# Patient Record
Sex: Female | Born: 1990 | Race: Black or African American | Hispanic: No | Marital: Single | Smoking: Never smoker
Health system: Southern US, Community
[De-identification: ages and names within clinical notes are randomized; demographics above are authoritative.]

## PROBLEM LIST (undated history)

## (undated) ENCOUNTER — Inpatient Hospital Stay (HOSPITAL_COMMUNITY): Payer: Self-pay

## (undated) DIAGNOSIS — IMO0002 Reserved for concepts with insufficient information to code with codable children: Secondary | ICD-10-CM

## (undated) DIAGNOSIS — D649 Anemia, unspecified: Secondary | ICD-10-CM

## (undated) DIAGNOSIS — F419 Anxiety disorder, unspecified: Secondary | ICD-10-CM

## (undated) DIAGNOSIS — R7303 Prediabetes: Secondary | ICD-10-CM

## (undated) HISTORY — DX: Reserved for concepts with insufficient information to code with codable children: IMO0002

---
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
## 2009-07-06 ENCOUNTER — Ambulatory Visit: Payer: Self-pay | Admitting: Obstetrics and Gynecology

## 2009-07-06 LAB — CONVERTED CEMR LAB
Antibody Screen: NEGATIVE
Basophils Absolute: 0 10*3/uL (ref 0.0–0.1)
Eosinophils Absolute: 0.1 10*3/uL (ref 0.0–0.7)
Eosinophils Relative: 1 % (ref 0–5)
Hepatitis B Surface Ag: NEGATIVE
Hgb F Quant: 0.5 % (ref 0.0–2.0)
Hgb S Quant: 0 % (ref 0.0–0.0)
Lymphocytes Relative: 18 % (ref 12–46)
Lymphs Abs: 1.7 10*3/uL (ref 0.7–4.0)
MCHC: 31.3 g/dL (ref 30.0–36.0)
MCV: 76.9 fL — ABNORMAL LOW (ref 78.0–100.0)
Neutro Abs: 7.1 10*3/uL (ref 1.7–7.7)
Neutrophils Relative %: 75 % (ref 43–77)
RBC: 3.9 M/uL (ref 3.87–5.11)
RDW: 16.8 % — ABNORMAL HIGH (ref 11.5–15.5)
WBC: 9.4 10*3/uL (ref 4.0–10.5)

## 2009-07-07 ENCOUNTER — Ambulatory Visit (HOSPITAL_COMMUNITY): Admission: RE | Admit: 2009-07-07 | Discharge: 2009-07-07 | Payer: Self-pay | Admitting: Obstetrics and Gynecology

## 2009-07-20 ENCOUNTER — Ambulatory Visit: Payer: Self-pay | Admitting: Obstetrics & Gynecology

## 2009-08-03 ENCOUNTER — Ambulatory Visit: Payer: Self-pay | Admitting: Obstetrics and Gynecology

## 2009-08-03 LAB — CONVERTED CEMR LAB: GC Probe Amp, Genital: NEGATIVE

## 2009-08-04 ENCOUNTER — Encounter: Payer: Self-pay | Admitting: Obstetrics and Gynecology

## 2009-08-11 ENCOUNTER — Inpatient Hospital Stay (HOSPITAL_COMMUNITY): Admission: AD | Admit: 2009-08-11 | Discharge: 2009-08-11 | Payer: Self-pay | Admitting: Family Medicine

## 2009-08-24 ENCOUNTER — Ambulatory Visit: Payer: Self-pay | Admitting: Obstetrics and Gynecology

## 2009-08-31 ENCOUNTER — Inpatient Hospital Stay (HOSPITAL_COMMUNITY): Admission: AD | Admit: 2009-08-31 | Discharge: 2009-09-02 | Payer: Self-pay | Admitting: Obstetrics & Gynecology

## 2009-08-31 ENCOUNTER — Ambulatory Visit: Payer: Self-pay | Admitting: Advanced Practice Midwife

## 2009-08-31 ENCOUNTER — Ambulatory Visit: Payer: Self-pay | Admitting: Obstetrics and Gynecology

## 2009-10-07 ENCOUNTER — Ambulatory Visit: Payer: Self-pay | Admitting: Obstetrics & Gynecology

## 2010-03-26 DIAGNOSIS — R87619 Unspecified abnormal cytological findings in specimens from cervix uteri: Secondary | ICD-10-CM

## 2010-03-26 DIAGNOSIS — IMO0002 Reserved for concepts with insufficient information to code with codable children: Secondary | ICD-10-CM

## 2010-03-26 HISTORY — DX: Unspecified abnormal cytological findings in specimens from cervix uteri: R87.619

## 2010-03-26 HISTORY — DX: Reserved for concepts with insufficient information to code with codable children: IMO0002

## 2010-06-09 ENCOUNTER — Ambulatory Visit: Payer: Self-pay | Admitting: Family Medicine

## 2010-06-12 LAB — RAPID URINE DRUG SCREEN, HOSP PERFORMED
Amphetamines: NOT DETECTED
Cocaine: NOT DETECTED
Tetrahydrocannabinol: NOT DETECTED

## 2010-06-12 LAB — POCT URINALYSIS DIP (DEVICE)
Bilirubin Urine: NEGATIVE
Glucose, UA: NEGATIVE mg/dL
Glucose, UA: NEGATIVE mg/dL
Ketones, ur: NEGATIVE mg/dL
Ketones, ur: NEGATIVE mg/dL
Nitrite: NEGATIVE
Nitrite: NEGATIVE
Protein, ur: NEGATIVE mg/dL
Specific Gravity, Urine: 1.01 (ref 1.005–1.030)
Urobilinogen, UA: 0.2 mg/dL (ref 0.0–1.0)
pH: 6.5 (ref 5.0–8.0)

## 2010-06-12 LAB — HIV ANTIBODY (ROUTINE TESTING W REFLEX): HIV: NONREACTIVE

## 2010-06-12 LAB — CBC
HCT: 36.9 % (ref 36.0–46.0)
Hemoglobin: 12 g/dL (ref 12.0–15.0)
MCHC: 32.5 g/dL (ref 30.0–36.0)
RBC: 3.57 MIL/uL — ABNORMAL LOW (ref 3.87–5.11)
WBC: 15.1 10*3/uL — ABNORMAL HIGH (ref 4.0–10.5)

## 2010-06-13 LAB — POCT URINALYSIS DIP (DEVICE)
Bilirubin Urine: NEGATIVE
Bilirubin Urine: NEGATIVE
Glucose, UA: NEGATIVE mg/dL
Hgb urine dipstick: NEGATIVE
Ketones, ur: NEGATIVE mg/dL
Ketones, ur: NEGATIVE mg/dL
Nitrite: NEGATIVE
Nitrite: NEGATIVE
Protein, ur: NEGATIVE mg/dL
Specific Gravity, Urine: 1.02 (ref 1.005–1.030)
Urobilinogen, UA: 0.2 mg/dL (ref 0.0–1.0)
pH: 6 (ref 5.0–8.0)

## 2010-06-14 LAB — POCT URINALYSIS DIP (DEVICE)
Hgb urine dipstick: NEGATIVE
Nitrite: NEGATIVE
Urobilinogen, UA: 0.2 mg/dL (ref 0.0–1.0)
pH: 7 (ref 5.0–8.0)

## 2010-07-03 ENCOUNTER — Ambulatory Visit: Payer: Self-pay | Admitting: Obstetrics & Gynecology

## 2010-07-12 ENCOUNTER — Ambulatory Visit (INDEPENDENT_AMBULATORY_CARE_PROVIDER_SITE_OTHER): Payer: Medicaid Other | Admitting: Obstetrics and Gynecology

## 2010-07-12 ENCOUNTER — Other Ambulatory Visit: Payer: Self-pay | Admitting: Obstetrics and Gynecology

## 2010-07-12 DIAGNOSIS — Z124 Encounter for screening for malignant neoplasm of cervix: Secondary | ICD-10-CM

## 2010-07-12 DIAGNOSIS — Z01419 Encounter for gynecological examination (general) (routine) without abnormal findings: Secondary | ICD-10-CM

## 2010-07-13 NOTE — Group Therapy Note (Signed)
NAME:  Leslie Simpson, Leslie Simpson               ACCOUNT NO.:  [id number]  MEDICAL RECORD NO.:  [id number]           PATIENT TYPE:  A  LOCATION:  [facility]                   FACILITY:  [facility]  PHYSICIAN:  Argentina Donovan, MD        DATE OF BIRTH:  11/16/1990  DATE OF SERVICE:  07/12/2010                                 CLINIC NOTE  The patient is a 20 year old African American female gravida 2, para 2-0- 0-2, in for her annual exam and first Pap smear, who delivered a baby successfully by VBAC in May 2011.  She nursed for 3 months and was given Depo-Provera.  She did not like it because of the weight gain, but she is afraid she is going to get pregnant.  She wanted to start on something __________ she has Medicaid.  We started her on Loestrin 11 Fe and I gave her prescription for that.  I told her that Medicaid may change which she is taking, but they should continue it.  She is going to start with the next period.  She had unprotected intercourse yesterday __________ she had a period on the 10th of the month.  I told her to wait until her next period before she started the pill.  The patient has no physical complaints.  PHYSICAL EXAMINATION:  ABDOMEN:  Soft, nontender.  No masses, no organomegaly. EXTERNAL GENITALIA:  Normal.  BUS within normal limits.  Vagina is clean.  There is a sort of a creamy discharge, non foul, no positive whiff.  Pap smear was taken.  Paracervix is parous.  Uterus anterior with normal size, shape, consistency.  Adnexa could not feel enlarged.  IMPRESSION:  Normal gynecological examination.  The patient during the Pap smear, was checked for wet prep as well as GC/Chlamydia and she want to be checked for all the other sexually transmitted diseases that we could do, so we are checking for hepatitis as well as HIV, syphilis and herpes simplex II.          ______________________________ Argentina Donovan, MD    PR/MEDQ  D:  07/12/2010  T:  07/13/2010  Job:  [id number]

## 2010-08-31 ENCOUNTER — Other Ambulatory Visit: Payer: Self-pay | Admitting: Obstetrics & Gynecology

## 2010-08-31 ENCOUNTER — Encounter (INDEPENDENT_AMBULATORY_CARE_PROVIDER_SITE_OTHER): Payer: Medicaid Other | Admitting: Obstetrics & Gynecology

## 2010-08-31 ENCOUNTER — Other Ambulatory Visit (HOSPITAL_COMMUNITY)
Admission: RE | Admit: 2010-08-31 | Discharge: 2010-08-31 | Disposition: A | Discharge status: Home / Self Care | Payer: Medicaid Other | Source: Ambulatory Visit | Attending: Obstetrics and Gynecology | Admitting: Obstetrics and Gynecology

## 2010-08-31 DIAGNOSIS — R87612 Low grade squamous intraepithelial lesion on cytologic smear of cervix (LGSIL): Secondary | ICD-10-CM

## 2010-08-31 DIAGNOSIS — N870 Mild cervical dysplasia: Secondary | ICD-10-CM | POA: Insufficient documentation

## 2010-08-31 LAB — POCT PREGNANCY, URINE: Preg Test, Ur: NEGATIVE

## 2010-09-23 DIAGNOSIS — IMO0002 Reserved for concepts with insufficient information to code with codable children: Secondary | ICD-10-CM

## 2010-10-04 ENCOUNTER — Ambulatory Visit: Payer: Medicaid Other | Admitting: Obstetrics & Gynecology

## 2010-11-11 ENCOUNTER — Emergency Department (HOSPITAL_COMMUNITY)
Admission: EM | Admit: 2010-11-11 | Discharge: 2010-11-11 | Discharge status: Against Medical Advice | Payer: Medicaid Other | Attending: Emergency Medicine | Admitting: Emergency Medicine

## 2010-11-11 DIAGNOSIS — R059 Cough, unspecified: Secondary | ICD-10-CM | POA: Insufficient documentation

## 2010-11-11 DIAGNOSIS — J029 Acute pharyngitis, unspecified: Secondary | ICD-10-CM | POA: Insufficient documentation

## 2010-11-11 DIAGNOSIS — R209 Unspecified disturbances of skin sensation: Secondary | ICD-10-CM | POA: Insufficient documentation

## 2010-11-11 DIAGNOSIS — R05 Cough: Secondary | ICD-10-CM | POA: Insufficient documentation

## 2011-01-17 ENCOUNTER — Ambulatory Visit: Payer: Medicaid Other | Admitting: Obstetrics and Gynecology

## 2011-07-06 ENCOUNTER — Emergency Department (HOSPITAL_COMMUNITY)
Admission: EM | Admit: 2011-07-06 | Discharge: 2011-07-06 | Disposition: A | Discharge status: Home / Self Care | Payer: Medicaid Other | Attending: Emergency Medicine | Admitting: Emergency Medicine

## 2011-07-06 ENCOUNTER — Encounter (HOSPITAL_COMMUNITY): Payer: Self-pay | Admitting: Emergency Medicine

## 2011-07-06 DIAGNOSIS — S90129A Contusion of unspecified lesser toe(s) without damage to nail, initial encounter: Secondary | ICD-10-CM | POA: Insufficient documentation

## 2011-07-06 DIAGNOSIS — W208XXA Other cause of strike by thrown, projected or falling object, initial encounter: Secondary | ICD-10-CM | POA: Insufficient documentation

## 2011-07-06 DIAGNOSIS — S90211A Contusion of right great toe with damage to nail, initial encounter: Secondary | ICD-10-CM

## 2011-07-06 MED ORDER — TRAMADOL HCL 50 MG PO TABS: 50.0000 mg | ORAL_TABLET | Freq: Four times a day (QID) | ORAL | Status: AC | PRN

## 2011-07-06 NOTE — Discharge Instructions (Signed)
Please read and follow all provided instructions.  Your diagnoses today include:  1. Subungual hematoma of great toe of right foot     Tests performed today include:  Vital signs. See below for your results today.   Medications prescribed:   Ultram - narcotic pain medication  You have been prescribed narcotic pain medication such as Vicodin or Percocet: DO NOT drive or perform any activities that require you to be awake and alert because this medicine can make you drowsy. BE VERY CAREFUL not to take multiple medicines containing Tylenol (also called acetaminophen). Doing so can lead to an overdose which can damage your liver and cause liver failure and possibly death.    Ibuprofen - anti-inflammatory pain medication  Do not exceed 800mg  ibuprofen every 8 hours  You have been prescribed an anti-inflammatory medication or NSAID. Take with food. Take smallest effective dose for the shortest duration needed for your pain. Stop taking if you experience stomach pain or vomiting.   Take any prescribed medications only as directed.  Home care instructions:  Follow any educational materials contained in this packet.  Follow-up instructions: Please follow-up with your primary care provider in the next 3 days for further evaluation of your symptoms. If you do not have a primary care doctor -- see below for referral information.   Return instructions:   Please return to the Emergency Department if you experience worsening symptoms.   Return with worsening pain, swelling, or redness of your toe.  Please return if you have any other emergent concerns.  Additional Information:  Your vital signs today were: BP 108/62  Pulse 65  Temp 98.4 F (36.9 C)  Resp 18  SpO2 100%  LMP 06/09/2011 If your blood pressure (BP) was elevated above 135/85 this visit, please have this repeated by your doctor within one month. -------------- No Primary Care Doctor Call [facility]  [phone] Other  agencies that provide inexpensive medical care    [facility]  [phone]    [facility]  [phone]    [facility]  [phone]    [facility]  [phone]    [facility]  [phone]    [facility]  [phone] -------------- RESOURCE GUIDE:  Dental Problems  Patients with Medicaid: [facility] [phone]                                            [phone] Phone:  [phone]                                                   Phone:  [phone]  If unable to pay or uninsured, contact:  [facility] or [facility]. to become qualified for the adult dental clinic.  Chronic Pain Problems Contact [facility]  [phone] Patients need to be referred by their primary care doctor.  Insufficient Money for Medicine Contact [employer]:  call "211" or [facility] [phone].  Psychological Services [facility]  [phone] [facility]  [phone] [facility]   800 [phone] (emergency services [phone])  Substance Abuse Resources [facility]  [phone] [facility] [phone] [facility]  [place] [phone] Floydene Flock [phone] Residential & Outpatient Substance Abuse Program  [phone]  Abuse/Neglect [facility] Child Abuse Hotline [phone])191-8261 [facility] Child Abuse Hotline 934-507-0080 (After Hours)  Emergency Shelter [facility] [phone]  Maternity Homes [facility] [place] [facility] [phone] [facility] [facility] [phone]  [facility] Resources  [facility]     [employer]                          [facility]. [address] [place]                       [address]      [address]  Blondell Reveal Phone:  [phone]                                   Phone:  [phone]                 Phone:  [phone]  [facility] Phone:  [phone]  [facility] Child Abuse Hotline 564-212-1195 [phone] (After Hours)

## 2011-07-06 NOTE — ED Notes (Signed)
Pt states glass from screen door fell onto her RT foot.  States skin is intact but there is bruising and she wants it checked out.

## 2011-07-06 NOTE — ED Notes (Signed)
Pt had glass from a door fall on her foot yesterday. Great toenail is black and bruised. States pain is not as bad today but wanted to get it checked out anyway. NAD.

## 2011-07-06 NOTE — ED Provider Notes (Signed)
History     CSN: [id number]  Arrival date & time 07/06/11  3244   First MD Initiated Contact with Patient 07/06/11 1720      Chief Complaint  Patient presents with  . Foot Pain    RT foot    (Consider location/radiation/quality/duration/timing/severity/associated sxs/prior treatment) HPI Comments: Patient presents after dropping a piece of glass from a door on her toes yesterday. Question of breakthrough. Patient complains of right great toe pain that is gradually improving. 800 mg ibuprofen tried without relief. Patient can walk however she states she limps.  Patient is a 21 y.o. female presenting with lower extremity pain. The history is provided by the patient.  Foot Pain This is a new problem. The current episode started yesterday. The problem has been gradually improving. Associated symptoms include arthralgias. Pertinent negatives include no joint swelling, neck pain, numbness or weakness. The symptoms are aggravated by walking and bending. She has tried NSAIDs for the symptoms. The treatment provided no relief.    History reviewed. No pertinent past medical history.  Past Surgical History  Procedure Date  . Cesarean section     No family history on file.  History  Substance Use Topics  . Smoking status: Not on file  . Smokeless tobacco: Not on file  . Alcohol Use:     OB History    Grav Para Term Preterm Abortions TAB SAB Ect Mult Living                  Review of Systems  Constitutional: Negative for activity change.  HENT: Negative for neck pain.   Musculoskeletal: Positive for arthralgias and gait problem. Negative for back pain and joint swelling.  Skin: Positive for color change. Negative for wound.  Neurological: Negative for weakness and numbness.    Allergies  Raspberry  Home Medications  No current outpatient prescriptions on file.  BP 108/62  Pulse 65  Temp 98.4 F (36.9 C)  Resp 18  SpO2 100%  LMP 06/09/2011  Physical Exam  Nursing  note and vitals reviewed. Constitutional: She is oriented to person, place, and time. She appears well-developed and well-nourished.  HENT:  Head: Normocephalic and atraumatic.  Eyes: Pupils are equal, round, and reactive to light.  Neck: Normal range of motion. Neck supple.  Cardiovascular: Exam reveals no decreased pulses.   Pulses:      Dorsalis pedis pulses are 2+ on the right side, and 2+ on the left side.       Posterior tibial pulses are 2+ on the right side, and 2+ on the left side.  Musculoskeletal: Normal range of motion. She exhibits edema and tenderness.       Right foot: She exhibits tenderness and swelling. She exhibits normal range of motion.       Feet:  Neurological: She is alert and oriented to person, place, and time. No sensory deficit.       Sensation normal.  Skin: Skin is warm and dry.  Psychiatric: She has a normal mood and affect.    ED Course  Procedures (including critical care time)  Labs Reviewed - No data to display No results found.   1. Subungual hematoma of great toe of right foot     5:59 PM Patient seen and examined.   Vital signs reviewed and are as follows: Filed Vitals:   07/06/11 1647  BP: 108/62  Pulse: 65  Temp: 98.4 F (36.9 C)  Resp: 18   Toe nail cleaned with alcohol  swab and trepanation performed using 18g needle. Significant amount of blood drained. Patient noted improvement. Will d/c to home on ultram.   Patient counseled on use of narcotic pain medications. Counseled not to combine these medications with others containing tylenol. Urged not to drink alcohol, drive, or perform any other activities that requires focus while taking these medications. The patient verbalizes understanding and agrees with the plan.  Patient urged to return with worsening pain, redness, swelling of toe. Urged to see her primary care physician if no improvement in one week. Patient verbalizes understanding and agrees with the plan.  MDM    Subungual hematoma, do not suspect fracture as patient has reasonable range of motion in toe.        Renne Crigler, [place] 07/07/11 0230

## 2011-07-13 NOTE — ED Provider Notes (Signed)
Medical screening examination/treatment/procedure(s) were performed by non-physician practitioner and as supervising physician I was immediately available for consultation/collaboration.  Raeford Razor, MD 07/13/11 [phone]

## 2011-07-19 ENCOUNTER — Ambulatory Visit: Payer: Medicaid Other | Admitting: Advanced Practice Midwife

## 2011-08-01 ENCOUNTER — Ambulatory Visit (INDEPENDENT_AMBULATORY_CARE_PROVIDER_SITE_OTHER): Payer: Medicaid Other | Admitting: Advanced Practice Midwife

## 2011-08-01 ENCOUNTER — Other Ambulatory Visit (HOSPITAL_COMMUNITY)
Admission: RE | Admit: 2011-08-01 | Discharge: 2011-08-01 | Disposition: A | Discharge status: Home / Self Care | Payer: Medicaid Other | Source: Ambulatory Visit | Attending: Obstetrics and Gynecology | Admitting: Obstetrics and Gynecology

## 2011-08-01 ENCOUNTER — Encounter: Payer: Self-pay | Admitting: Advanced Practice Midwife

## 2011-08-01 VITALS — BP 105/62 | HR 75 | Temp 98.4°F | Ht 62.0 in | Wt 153.8 lb

## 2011-08-01 DIAGNOSIS — N870 Mild cervical dysplasia: Secondary | ICD-10-CM

## 2011-08-01 DIAGNOSIS — Z113 Encounter for screening for infections with a predominantly sexual mode of transmission: Secondary | ICD-10-CM | POA: Insufficient documentation

## 2011-08-01 DIAGNOSIS — Z01419 Encounter for gynecological examination (general) (routine) without abnormal findings: Secondary | ICD-10-CM | POA: Insufficient documentation

## 2011-08-01 DIAGNOSIS — A64 Unspecified sexually transmitted disease: Secondary | ICD-10-CM

## 2011-08-01 LAB — RPR

## 2011-08-01 NOTE — Patient Instructions (Signed)
Pap Test A Pap test checks the cells in the cervix for any infections or changes that may turn into cancer. Have a Pap test every year if:  You are having sex (intercourse).   You are high risk for cervical cancer. You are high risk if:   You started having sex at a very young age.   You have sex with a lot of different partners.   You smoke.   You have human papillomavirus (HPV).   You have HIV.  BEFORE THE PROCEDURE  Schedule the Pap test before or after your period.   Do not have sex for 2 days before your Pap test.   For 3 days before your Pap test, do not douche or put any creams or medicines in your vagina.  PROCEDURE  Try to relax. You will be more comfortable. Try breathing slowly and deeply. Do not tighten up your muscles.   A tool (speculum) is put into your vagina and opens it up.   Your doctor will use a special swab or brush to take cells from your cervix.   You may feel pressure or pulling as the cells are taken off your cervix.   The cells will be checked under a microscope.   Safer Sex Your caregiver wants you to have this information about the infections that can be transmitted from sexual contact and how to prevent them. The idea behind safer sex is that you can be sexually active, and at the same time reduce the risk of giving or getting a sexually transmitted disease (STD). Every person should be aware of how to prevent him or herself and his or her sex partner from getting an STD. CAUSES OF STDS STDs are transmitted by sharing body fluids, which contain viruses and bacteria. The following fluids all transmit infections during sexual intercourse and sex acts:  Semen.   Saliva.   Urine.   Blood.   Vaginal mucus.  Examples of STDs include:  Chlamydia.   Gonorrhea.   Genital herpes.   Hepatitis B.   Human immunodeficiency virus or acquired immunodeficiency syndrome (HIV or AIDS).   Syphilis.   Trichomonas.   Pubic lice.   Human  papillomavirus (HPV), which may include:   Genital warts.   Cervical dysplasia.   Cervical cancer (can develop with certain types of HPV).  SYMPTOMS  Sexual diseases often cause few or no symptoms until they are advanced, so a person can be infected and spread the infection without knowing it. Some STDs respond to treatment very well. Others, like HIV and herpes, cannot be cured, but are treated to reduce their effects. Specific symptoms include:  Abnormal vaginal discharge.   Irritation or itching in and around the vagina, and in the pubic hair.   Pain during sexual intercourse.   Bleeding during sexual intercourse.   Pelvic or abdominal pain.   Fever.   Growths in and around the vagina.   An ulcer in or around the vagina.   Swollen glands in the groin area.  DIAGNOSIS   Blood tests.   Pap test.   Culture test of abnormal vaginal discharge.   A test that applies a solution and examines the cervix with a lighted magnifying scope (colposcopy).   A test that examines the pelvis with a lighted tube, through a small incision (laparoscopy).  TREATMENT  The treatment will depend on the cause of the STD.  Antibiotic treatment by injection, oral, creams, or suppositories in the vagina.   Over-the-counter  medicated shampoo, to get rid of pubic lice.   Removing or treating growths with medicine, freezing, burning (electrocautery), or surgery.   Surgery treatment for HPV of the cervix.   Supportive medicines for herpes, HIV, AIDS, and hepatitis.  Being careful cannot eliminate all risk of infection, but sex can be made much safer. Safe sexual practices include body massage and gentle touching. Masturbation is safe, as long as body fluids do not contact skin that has sores or cuts. Dry kissing and oral sex on a man wearing a latex condom or on a woman wearing a female condom is also safe. Slightly less safe is intercourse while the man wears a latex condom or wet kissing. It  is also safer to have one sex partner that you know is not having sex with anyone else. LENGTH OF ILLNESS An STD might be treated and cured in a week, sometimes a month, or more. And it can linger with symptoms for many years. STDs can also cause damage to the female organs. This can cause chronic pain, infertility, and recurrence of the STD, especially herpes, hepatitis, HIV, and HPV. HOME CARE INSTRUCTIONS AND PREVENTION  Alcohol and recreational drugs are often the reason given for not practicing safer sex. These substances affect your judgment. Alcohol and recreational drugs can also impair your immune system, making you more vulnerable to disease.   Do not engage in risky and dangerous sexual practices, including:   Vaginal or anal sex without a condom.   Oral sex on a man without a condom.   Oral sex on a woman without a female condom.   Using saliva to lubricate a condom.   Any other sexual contact in which body fluids or blood from one partner contact the other partner.   You should use only latex condoms for men and water soluble lubricants. Petroleum based lubricants or oils used to lubricate a condom will weaken the condom and increase the chance that it will break.   Think very carefully before having sex with anyone who is high risk for STDs and HIV. This includes IV drug users, people with multiple sexual partners, or people who have had an STD, or a positive hepatitis or HIV blood test.   Remember that even if your partner has had only one previous partner, their previous partner might have had multiple partners. If so, you are at high risk of being exposed to an STD. You and your sex partner should be the only sex partners with each other, with no one else involved.   A vaccine is available for hepatitis B and HPV through your caregiver or the Public Health Department. Everyone should be vaccinated with these vaccines.   Avoid risky sex practices. Sex acts that can break  the skin make you more likely to get an STD.  SEEK MEDICAL CARE IF:   If you think you have an STD, even if you do not have any symptoms. Contact your caregiver for evaluation and treatment, if needed.   You think or know your sex partner has acquired an STD.   You have any of the symptoms mentioned above.  Document Released: 04/19/2004 Document Revised: 03/01/2011 Document Reviewed: 02/09/2009 [facility] Patient Information 2012 [place], [place]. AFTER THE PROCEDURE You may have a few spots of blood from your vagina for 2 to 3 days. If you have a lot of bleeding, or it does not stop in a few days, tell your doctor. Finding out the results of your test Ask  when your test results will be ready. Make sure you get your test results. PAP TEST RECOMMENDATIONS  Your first Pap test should be done at age 59.   Pap tests are repeated every 2 years between ages 50 and 26.   Beginning at age 29, you should have a Pap test every 3 years as long as your past 3 PAP tests have been normal.   If you are at risk of getting cervical cancer. Talk to your caregiver if a new problem shows up soon after your last Pap test.   If you have had past treatment for cervical cancer or a condition that could lead to cancer, you need Pap tests and screening for cancer for at least 20 years after your treatment.  WHO DOES NOT NEED TO HAVE A PAP TEST? You do not need a Pap test if:  If you had a hysterectomy for a problem that was not cancer or a condition that could lead to cancer. A regular exam is still recommended.    If you are between ages 72 and 73, and you have had normal Pap tests for 10 years. A regular exam is still recommended.    If Pap tests have been discontinued, risk factors (such as a new sexual partner)need to be looked at again to see if screening should be started again.  Document Released: 04/14/2010 Document Revised: 03/01/2011 Document Reviewed: 04/14/2010 [facility] Patient Information  2012 [place], [place].

## 2011-08-01 NOTE — Progress Notes (Signed)
  Subjective:     Leslie Simpson is a 21 y.o. woman who comes in today for a  pap smear and STD screening. Her most recent annual exam was on 06/2010. 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 Follow up was recommended for December, but patient did not keep that appointment. Requests STD testing today. Reports discharge x 2 weeks, also states she thinks she noticed a bump on her right labia, not painful. Contraception: condoms  The following portions of the patient's history were reviewed and updated as appropriate: allergies, current medications, past family history, past medical history, past social history, past surgical history and problem list.  Review of Systems Pertinent items are noted in HPI.   Objective:    LMP 06/09/2011 Physical Exam  Nursing note and vitals reviewed. Constitutional: She is oriented to person, place, and time. She appears well-developed and well-nourished. No distress.  Musculoskeletal: Normal range of motion.  Neurological: She is alert and oriented to person, place, and time.  Skin: Skin is warm and dry.  Psychiatric: She has a normal mood and affect.    Pelvic Exam: cervix normal in appearance, external genitalia normal, positive findings: vaginal discharge:  white and uterus normal size, shape, and consistency. Pap smear obtained.   Assessment:    Follow up pap smear.  History of abnormal pap.  STD screening  Plan:  Pap, GC, wet prep, HIV, RPR and Hep B today as requested per patient  Follow up as indicated by Pap results.

## 2011-08-02 LAB — WET PREP, GENITAL: Trich, Wet Prep: NONE SEEN

## 2011-08-07 ENCOUNTER — Telehealth: Payer: Self-pay | Admitting: *Deleted

## 2011-08-07 MED ORDER — FLUCONAZOLE 150 MG PO TABS: 150.0000 mg | ORAL_TABLET | Freq: Once | ORAL | Status: AC

## 2011-08-07 MED ORDER — METRONIDAZOLE 500 MG PO TABS: 500.0000 mg | ORAL_TABLET | Freq: Two times a day (BID) | ORAL | Status: AC

## 2011-08-07 NOTE — Telephone Encounter (Signed)
Pt left message requesting test results.  I reviewed results and consulted with Dr. Macon Large.  I called pt and informed her of results of all tests performed and treatment ordered. Pt voiced understanding.

## 2011-11-02 IMAGING — US US OB DETAIL+14 WK
2 series · 14 of 28 positions shown · non-contrast
Comparison: none

OBSTETRICAL ULTRASOUND:
 This ultrasound exam was performed in the [HOSPITAL] Ultrasound Department.  The OB US report was generated in the AS system, and faxed to the ordering physician.  This report is also available in [HOSPITAL]?s AccessANYware and in [REDACTED] PACS.

[Series 1: us ob detail +14 wk · 82 acquisitions, 13 frames shown (1 of 2)]
[im 4/82]
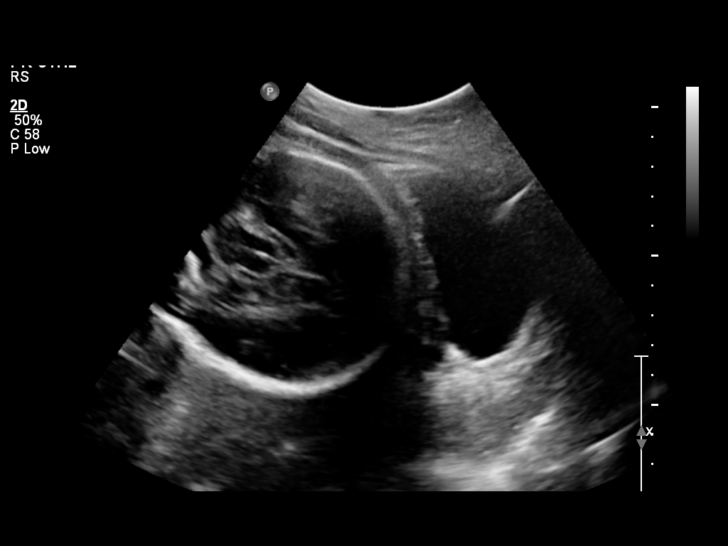
[im 10/82]
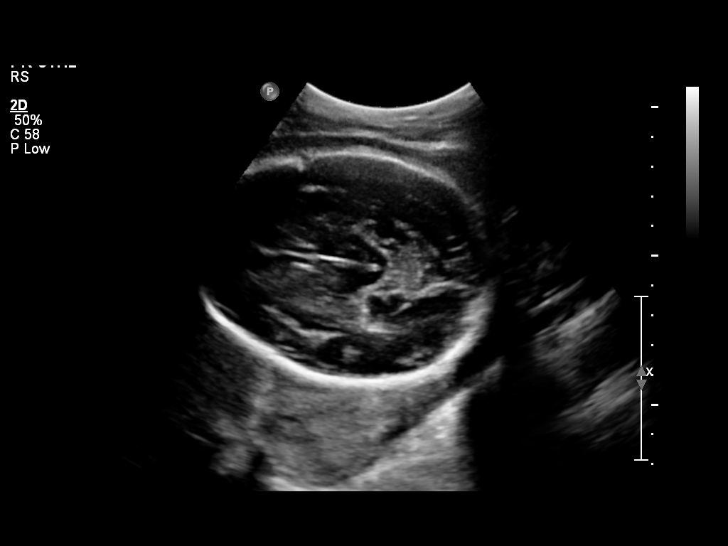
[im 17/82]
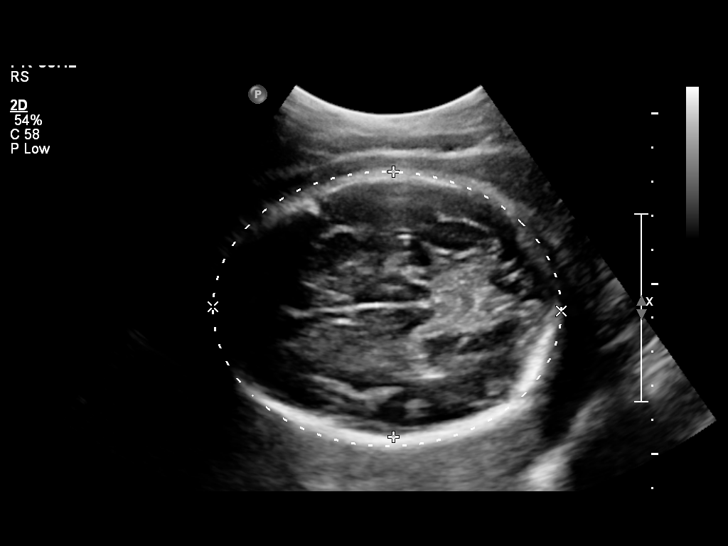
[im 23/82]
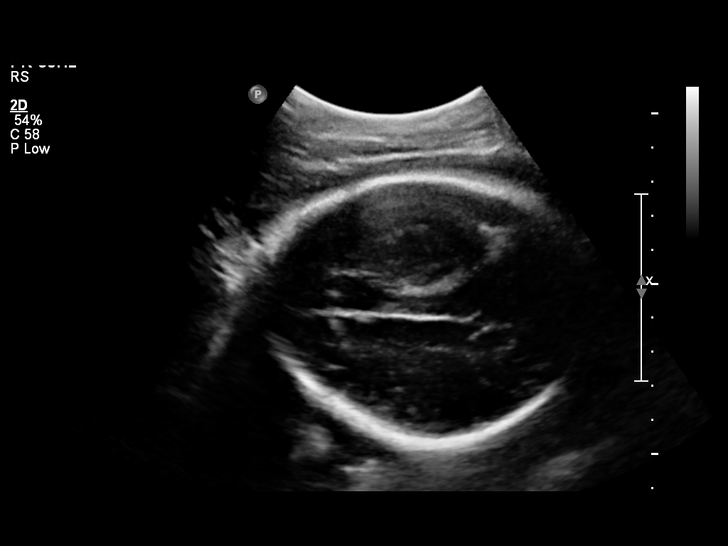
[im 30/82]
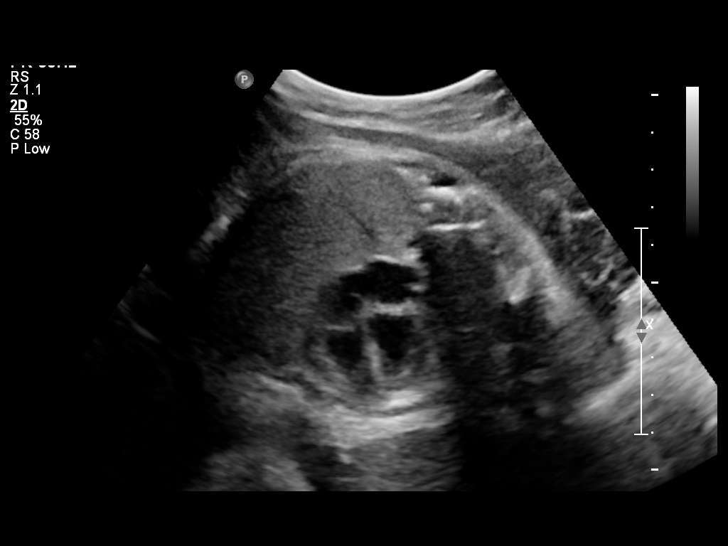
[im 36/82]
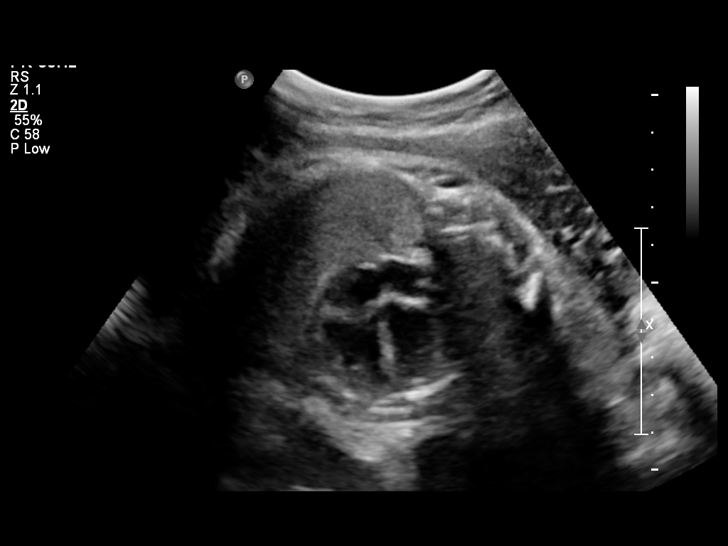
[im 43/82]
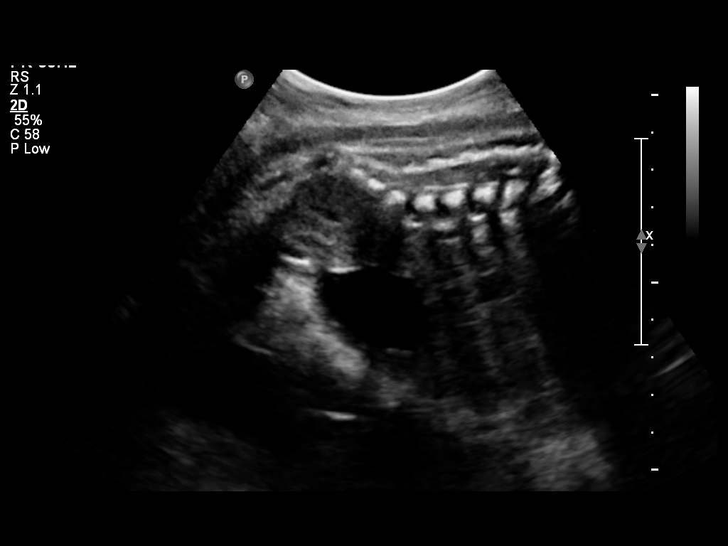
[im 49/82]
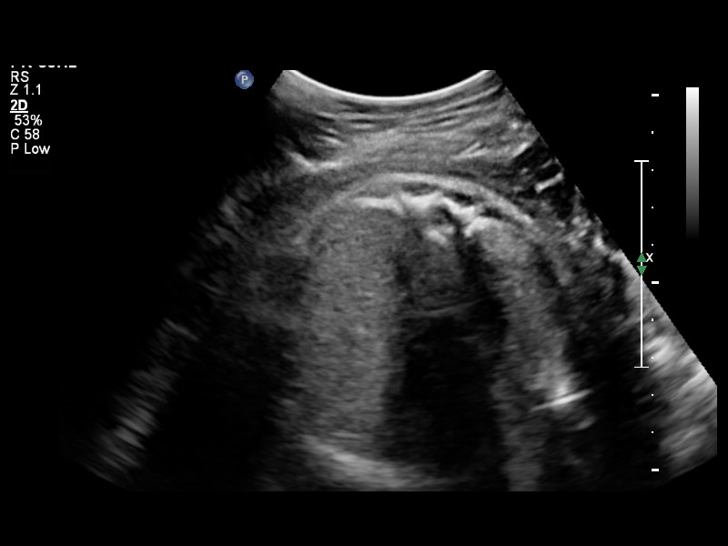
[im 56/82]
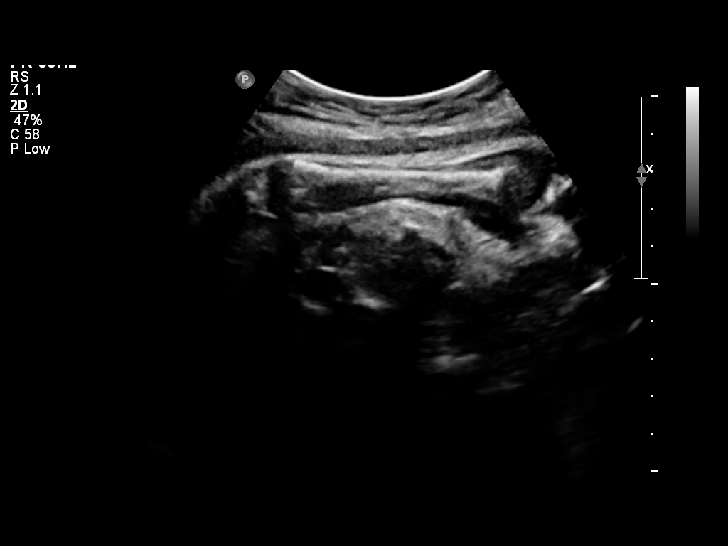
[im 62/82]
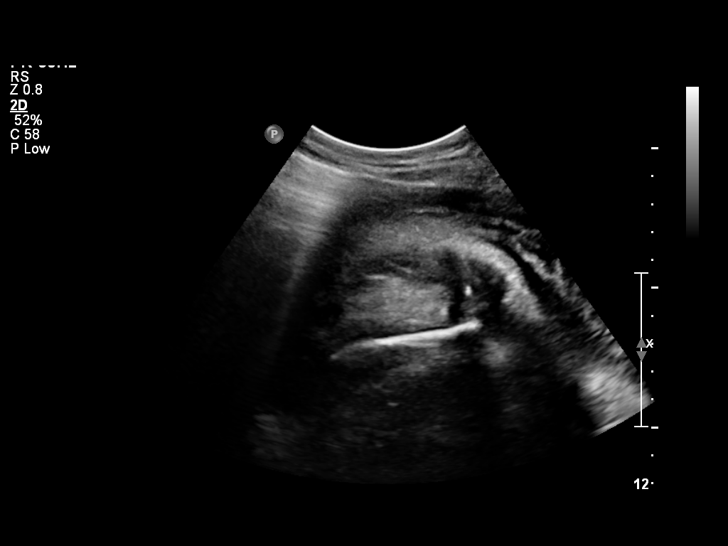
[im 69/82]
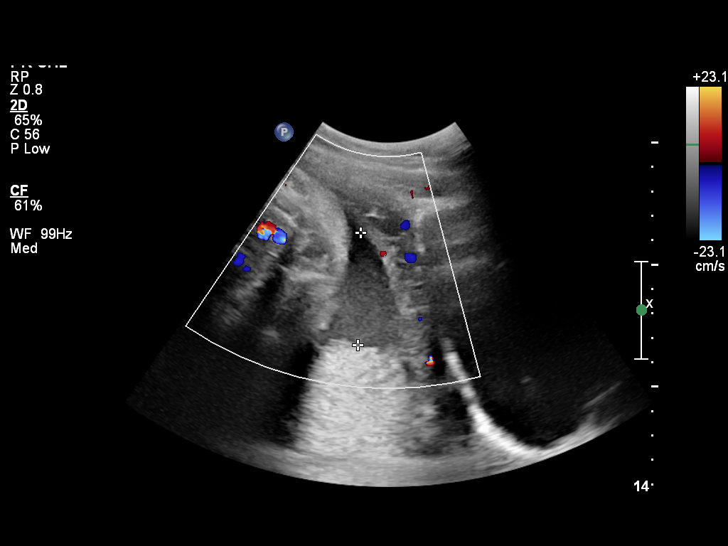
[im 75/82]
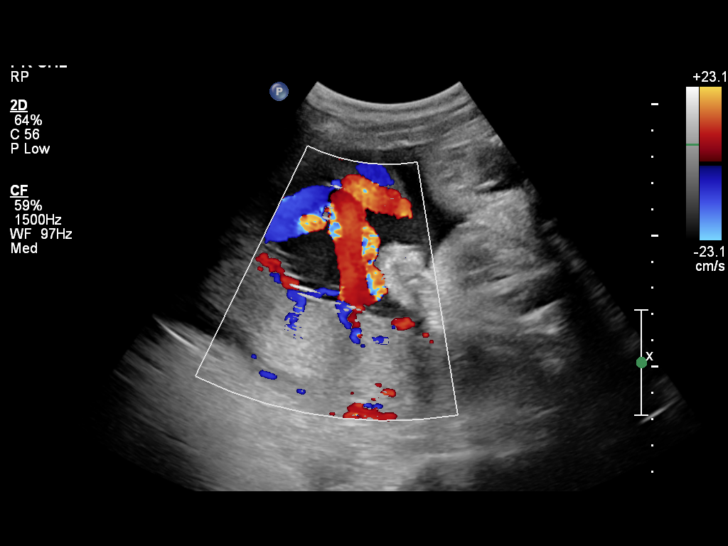
[im 82/82]
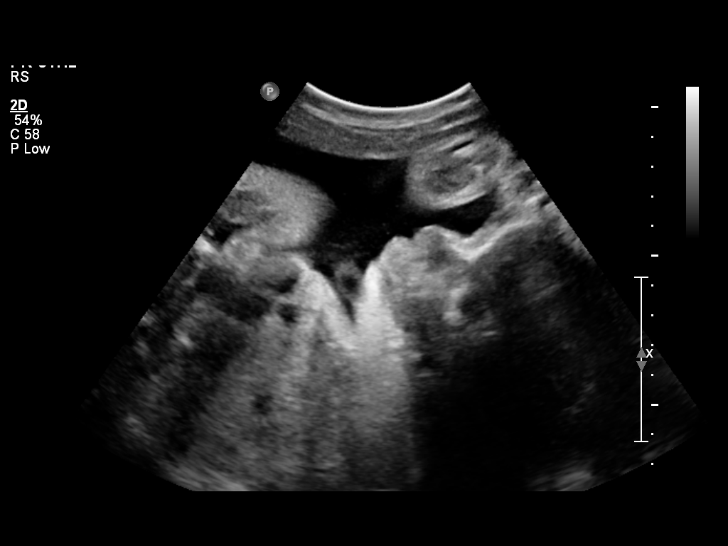

[Series 1: us ob detail +14 wk · 1 of 6 slices shown (2 of 2)]
[im 6/6]
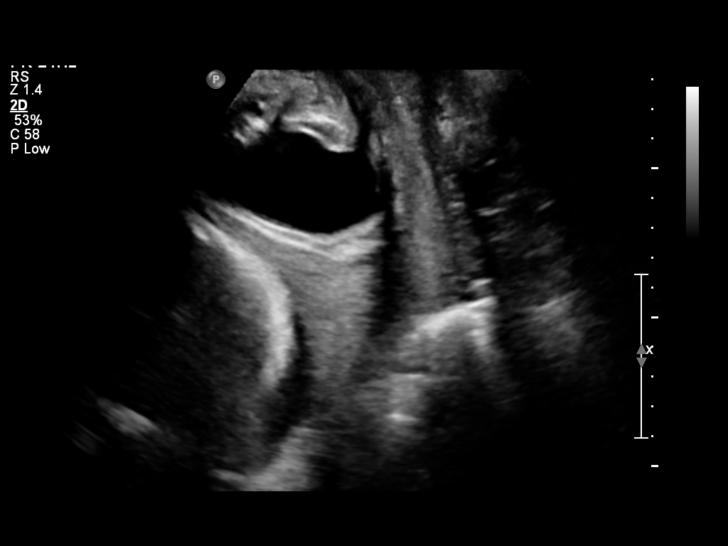

[14 of 28 positions shown; findings below may reference images not displayed]

IMPRESSION: See AS Obstetric US report.

## 2013-08-20 ENCOUNTER — Ambulatory Visit: Payer: Medicaid Other | Admitting: Family Medicine

## 2013-12-06 ENCOUNTER — Encounter (HOSPITAL_COMMUNITY): Payer: Self-pay | Admitting: Emergency Medicine

## 2013-12-06 ENCOUNTER — Emergency Department (HOSPITAL_COMMUNITY)
Admission: EM | Admit: 2013-12-06 | Discharge: 2013-12-06 | Disposition: A | Discharge status: Home / Self Care | Payer: Medicaid Other | Attending: Emergency Medicine | Admitting: Emergency Medicine

## 2013-12-06 DIAGNOSIS — R0782 Intercostal pain: Secondary | ICD-10-CM

## 2013-12-06 DIAGNOSIS — A599 Trichomoniasis, unspecified: Secondary | ICD-10-CM

## 2013-12-06 DIAGNOSIS — Z3202 Encounter for pregnancy test, result negative: Secondary | ICD-10-CM | POA: Insufficient documentation

## 2013-12-06 DIAGNOSIS — A499 Bacterial infection, unspecified: Secondary | ICD-10-CM | POA: Insufficient documentation

## 2013-12-06 DIAGNOSIS — R0789 Other chest pain: Secondary | ICD-10-CM | POA: Insufficient documentation

## 2013-12-06 DIAGNOSIS — R109 Unspecified abdominal pain: Secondary | ICD-10-CM | POA: Insufficient documentation

## 2013-12-06 DIAGNOSIS — B9689 Other specified bacterial agents as the cause of diseases classified elsewhere: Secondary | ICD-10-CM

## 2013-12-06 DIAGNOSIS — N390 Urinary tract infection, site not specified: Secondary | ICD-10-CM

## 2013-12-06 DIAGNOSIS — N760 Acute vaginitis: Secondary | ICD-10-CM | POA: Insufficient documentation

## 2013-12-06 DIAGNOSIS — A5901 Trichomonal vulvovaginitis: Secondary | ICD-10-CM | POA: Insufficient documentation

## 2013-12-06 LAB — WET PREP, GENITAL: Yeast Wet Prep HPF POC: NONE SEEN

## 2013-12-06 LAB — URINALYSIS, ROUTINE W REFLEX MICROSCOPIC
Bilirubin Urine: NEGATIVE
GLUCOSE, UA: NEGATIVE mg/dL
KETONES UR: NEGATIVE mg/dL
Nitrite: NEGATIVE
Protein, ur: NEGATIVE mg/dL
Specific Gravity, Urine: 1.02 (ref 1.005–1.030)
Urobilinogen, UA: 0.2 mg/dL (ref 0.0–1.0)
pH: 6 (ref 5.0–8.0)

## 2013-12-06 LAB — URINE MICROSCOPIC-ADD ON

## 2013-12-06 LAB — HIV ANTIBODY (ROUTINE TESTING W REFLEX): HIV 1&2 Ab, 4th Generation: NONREACTIVE

## 2013-12-06 LAB — RPR

## 2013-12-06 MED ORDER — METRONIDAZOLE 500 MG PO TABS: 500.0000 mg | ORAL_TABLET | Freq: Two times a day (BID) | ORAL | Status: DC

## 2013-12-06 MED ORDER — LIDOCAINE HCL 1 % IJ SOLN
INTRAMUSCULAR | Status: AC
  Administered 2013-12-06: 20 mL
  Filled 2013-12-06: qty 20

## 2013-12-06 MED ORDER — AZITHROMYCIN 1 G PO PACK
1.0000 g | PACK | Freq: Once | ORAL | Status: AC
  Administered 2013-12-06: 1 g via ORAL
  Filled 2013-12-06: qty 1

## 2013-12-06 MED ORDER — CEFTRIAXONE SODIUM 250 MG IJ SOLR
250.0000 mg | Freq: Once | INTRAMUSCULAR | Status: AC
  Administered 2013-12-06: 250 mg via INTRAMUSCULAR
  Filled 2013-12-06: qty 250

## 2013-12-06 MED ORDER — CEPHALEXIN 500 MG PO CAPS: 500.0000 mg | ORAL_CAPSULE | Freq: Four times a day (QID) | ORAL | Status: DC

## 2013-12-06 NOTE — ED Provider Notes (Addendum)
CSN: [id number]     Arrival date & time 12/06/13  1016 History   First MD Initiated Contact with Patient 12/06/13 1040     Chief Complaint  Patient presents with  . Abdominal Pain  . Chest Pain     (Consider location/radiation/quality/duration/timing/severity/associated sxs/prior Treatment) HPI Comments: Patient here complaining of worsening sharp chest pain x1 year which is been intermittent. Symptoms worse with certain movements and denies any anginal components. No cough or congestion. No rashes to her chest noted. Symptoms last for seconds and resolve with remaining still. No treatment ever used for this. Denies any syncope or near-syncope.  Patient also complains of 2 month history of intermittent possible vaginal bleeding. Notes pink tinged staining on the toliet paper after she wipes. Patient is unsure if the blood is from her vaginal or her urethra. Denies any associated abdominal cramping. Denies any dysuria. Symptoms persisted nothing makes it better. No treatment used prior to arrival.  Patient is a 23 y.o. female presenting with abdominal pain and chest pain. The history is provided by the patient.  Abdominal Pain Associated symptoms: chest pain   Chest Pain Associated symptoms: abdominal pain     Past Medical History  Diagnosis Date  . Abnormal pap 2012   Past Surgical History  Procedure Laterality Date  . Cesarean section     No family history on file. History  Substance Use Topics  . Smoking status: Never Smoker   . Smokeless tobacco: Never Used  . Alcohol Use: Yes     Comment: social drinking   OB History   Grav Para Term Preterm Abortions TAB SAB Ect Mult Living   Review of Systems  Cardiovascular: Positive for chest pain.  Gastrointestinal: Positive for abdominal pain.  All other systems reviewed and are negative.     Allergies  Oysters and Raspberry  Home Medications   Prior to Admission medications   Medication Sig Start  Date End Date Taking? Authorizing Provider  ibuprofen (ADVIL,MOTRIN) 200 MG tablet Take 400 mg by mouth every 6 (six) hours as needed for moderate pain.   Yes Historical Provider, MD   BP 118/57  Pulse 62  Temp(Src) 98 F (36.7 C) (Oral)  Resp 20  SpO2 100%  LMP 11/21/2013 Physical Exam  Nursing note and vitals reviewed. Constitutional: She is oriented to person, place, and time. She appears well-developed and well-nourished.  Non-toxic appearance. No distress.  HENT:  Head: Normocephalic and atraumatic.  Eyes: Conjunctivae, EOM and lids are normal. Pupils are equal, round, and reactive to light.  Neck: Normal range of motion. Neck supple. No tracheal deviation present. No mass present.  Cardiovascular: Normal rate, regular rhythm and normal heart sounds.  Exam reveals no gallop.   No murmur heard. Pulmonary/Chest: Effort normal and breath sounds normal. No stridor. No respiratory distress. She has no decreased breath sounds. She has no wheezes. She has no rhonchi. She has no rales.    Abdominal: Soft. Normal appearance and bowel sounds are normal. She exhibits no distension. There is no tenderness. There is no rebound and no CVA tenderness.  Genitourinary: No bleeding around the vagina. Vaginal discharge found.  Musculoskeletal: Normal range of motion. She exhibits no edema and no tenderness.  Neurological: She is alert and oriented to person, place, and time. She has normal strength. No cranial nerve deficit or sensory deficit. GCS eye subscore is 4. GCS verbal subscore is 5. GCS  motor subscore is 6.  Skin: Skin is warm and dry. No abrasion and no rash noted.  Psychiatric: She has a normal mood and affect. Her speech is normal and behavior is normal.    ED Course  Procedures (including critical care time) Labs Review Labs Reviewed  URINE CULTURE  GC/CHLAMYDIA PROBE AMP  WET PREP, GENITAL  URINALYSIS, ROUTINE W REFLEX MICROSCOPIC  POC URINE PREG, ED    Imaging Review No  results found.   EKG Interpretation   Date/Time:  Sunday December 06 2013 11:23:56 EDT Ventricular Rate:  63 PR Interval:  210 QRS Duration: 84 QT Interval:  390 QTC Calculation: 399 R Axis:   82 Text Interpretation:  Sinus arrhythmia Prolonged PR interval Baseline  wander in lead(s) II III aVF V2 Confirmed by Evey Mcmahan  MD, Corinthian Kemler ([id number]) on  12/06/2013 1:24:19 PM      MDM   Final diagnoses:  None   Chest wall pain appears to be MSK Pt to be treated for STI and uti with multiple meds    Toy Baker, MD 12/06/13 1328  Toy Baker, MD 12/06/13 1328

## 2013-12-06 NOTE — ED Notes (Addendum)
Pt c/o cramping chest pain for over a year, but more sharp today and moving into back. Pain without activity. Denies diaphoresis. Also c/o intermittent upper L abd pain, cramping worse when urinating. Reports what she believes is blood in urine x2. Also reports vaginal bleeding, no clots or even period like, only sts a little blood on toilet paper after wiping. Denies n/v/d. Denies any urinary symptoms or fever.

## 2013-12-06 NOTE — Discharge Instructions (Signed)
Sexually Transmitted Disease °A sexually transmitted disease (STD) is a disease or infection that may be passed (transmitted) from person to person, usually during sexual activity. This may happen by way of saliva, semen, blood, vaginal mucus, or urine. Common STDs include:  °· Gonorrhea.   °· Chlamydia.   °· Syphilis.   °· HIV and AIDS.   °· Genital herpes.   °· Hepatitis B and C.   °· Trichomonas.   °· Human papillomavirus (HPV).   °· Pubic lice.   °· Scabies. °· Mites. °· Bacterial vaginosis. °WHAT ARE CAUSES OF STDs? °An STD may be caused by bacteria, a virus, or parasites. STDs are often transmitted during sexual activity if one person is infected. However, they may also be transmitted through nonsexual means. STDs may be transmitted after:  °· Sexual intercourse with an infected person.   °· Sharing sex toys with an infected person.   °· Sharing needles with an infected person or using unclean piercing or tattoo needles. °· Having intimate contact with the genitals, mouth, or rectal areas of an infected person.   °· Exposure to infected fluids during birth. °WHAT ARE THE SIGNS AND SYMPTOMS OF STDs? °Different STDs have different symptoms. Some people may not have any symptoms. If symptoms are present, they may include:  °· Painful or bloody urination.   °· Pain in the pelvis, abdomen, vagina, anus, throat, or eyes.   °· A skin rash, itching, or irritation. °· Growths, ulcerations, blisters, or sores in the genital and anal areas. °· Abnormal vaginal discharge with or without bad odor.   °· Penile discharge in men.   °· Fever.   °· Pain or bleeding during sexual intercourse.   °· Swollen glands in the groin area.   °· Yellow skin and eyes (jaundice). This is seen with hepatitis.   °· Swollen testicles. °· Infertility. °· Sores and blisters in the mouth. °HOW ARE STDs DIAGNOSED? °To make a diagnosis, your health care provider may:  °· Take a medical history.   °· Perform a physical exam.   °· Take a sample of  any discharge to examine. °· Swab the throat, cervix, opening to the penis, rectum, or vagina for testing. °· Test a sample of your first morning urine.   °· Perform blood tests.   °· Perform a Pap test, if this applies.   °· Perform a colposcopy.   °· Perform a laparoscopy.   °HOW ARE STDs TREATED? ° Treatment depends on the STD. Some STDs may be treated but not cured.  °· Chlamydia, gonorrhea, trichomonas, and syphilis can be cured with antibiotic medicine.   °· Genital herpes, hepatitis, and HIV can be treated, but not cured, with prescribed medicines. The medicines lessen symptoms.   °· Genital warts from HPV can be treated with medicine or by freezing, burning (electrocautery), or surgery. Warts may come back.   °· HPV cannot be cured with medicine or surgery. However, abnormal areas may be removed from the cervix, vagina, or vulva.   °· If your diagnosis is confirmed, your recent sexual partners need treatment. This is true even if they are symptom-free or have a negative culture or evaluation. They should not have sex until their health care providers say it is okay. °HOW CAN I REDUCE MY RISK OF GETTING AN STD? °Take these steps to reduce your risk of getting an STD: °· Use latex condoms, dental dams, and water-soluble lubricants during sexual activity. Do not use petroleum jelly or oils. °· Avoid having multiple sex partners. °· Do not have sex with someone who has other sex partners. °· Do not have sex with anyone you do not know or who is at   high risk for an STD.  Avoid risky sex practices that can break your skin.  Do not have sex if you have open sores on your mouth or skin.  Avoid drinking too much alcohol or taking illegal drugs. Alcohol and drugs can affect your judgment and put you in a vulnerable position.  Avoid engaging in oral and anal sex acts.  Get vaccinated for HPV and hepatitis. If you have not received these vaccines in the past, talk to your health care provider about whether one  or both might be right for you.   If you are at risk of being infected with HIV, it is recommended that you take a prescription medicine daily to prevent HIV infection. This is called pre-exposure prophylaxis (PrEP). You are considered at risk if:  You are a man who has sex with other men (MSM).  You are a heterosexual man or woman and are sexually active with more than one partner.  You take drugs by injection.  You are sexually active with a partner who has HIV.  Talk with your health care provider about whether you are at high risk of being infected with HIV. If you choose to begin PrEP, you should first be tested for HIV. You should then be tested every 3 months for as long as you are taking PrEP.  WHAT SHOULD I DO IF I THINK I HAVE AN STD?  See your health care provider.   Tell your sexual partner(s). They should be tested and treated for any STDs.  Do not have sex until your health care provider says it is okay. WHEN SHOULD I GET IMMEDIATE MEDICAL CARE? Contact your health care provider right away if:   You have severe abdominal pain.  You are a man and notice swelling or pain in your testicles.  You are a woman and notice swelling or pain in your vagina. Document Released: 06/02/2002 Document Revised: 03/17/2013 Document Reviewed: 09/30/2012 [facility] Patient Information 2015 [place], [place]. This information is not intended to replace advice given to you by your health care provider. Make sure you discuss any questions you have with your health care provider. Bacterial Vaginosis Bacterial vaginosis is a vaginal infection that occurs when the normal balance of bacteria in the vagina is disrupted. It results from an overgrowth of certain bacteria. This is the most common vaginal infection in women of childbearing age. Treatment is important to prevent complications, especially in pregnant women, as it can cause a premature delivery. CAUSES  Bacterial vaginosis is caused by  an increase in harmful bacteria that are normally present in smaller amounts in the vagina. Several different kinds of bacteria can cause bacterial vaginosis. However, the reason that the condition develops is not fully understood. RISK FACTORS Certain activities or behaviors can put you at an increased risk of developing bacterial vaginosis, including:  Having a new sex partner or multiple sex partners.  Douching.  Using an intrauterine device (IUD) for contraception. Women do not get bacterial vaginosis from toilet seats, bedding, swimming pools, or contact with objects around them. SIGNS AND SYMPTOMS  Some women with bacterial vaginosis have no signs or symptoms. Common symptoms include:  Grey vaginal discharge.  A fishlike odor with discharge, especially after sexual intercourse.  Itching or burning of the vagina and vulva.  Burning or pain with urination. DIAGNOSIS  Your health care provider will take a medical history and examine the vagina for signs of bacterial vaginosis. A sample of vaginal fluid may be taken. Your  health care provider will look at this sample under a microscope to check for bacteria and abnormal cells. A vaginal pH test may also be done.  TREATMENT  Bacterial vaginosis may be treated with antibiotic medicines. These may be given in the form of a pill or a vaginal cream. A second round of antibiotics may be prescribed if the condition comes back after treatment.  HOME CARE INSTRUCTIONS   Only take over-the-counter or prescription medicines as directed by your health care provider.  If antibiotic medicine was prescribed, take it as directed. Make sure you finish it even if you start to feel better.  Do not have sex until treatment is completed.  Tell all sexual partners that you have a vaginal infection. They should see their health care provider and be treated if they have problems, such as a mild rash or itching.  Practice safe sex by using condoms and  only having one sex partner. SEEK MEDICAL CARE IF:   Your symptoms are not improving after 3 days of treatment.  You have increased discharge or pain.  You have a fever. MAKE SURE YOU:   Understand these instructions.  Will watch your condition.  Will get help right away if you are not doing well or get worse. FOR MORE INFORMATION  Centers for Disease Control and Prevention, Division of STD Prevention: [employer]za American Sexual Health Association (ASHA): www.ashastd.org  Document Released: 03/12/2005 Document Revised: 12/31/2012 Document Reviewed: 10/22/2012 [facility] Patient Information 2015 [place], [place]. This information is not intended to replace advice given to you by your health care provider. Make sure you discuss any questions you have with your health care provider. Trichomoniasis Trichomoniasis is an infection caused by an organism called Trichomonas. The infection can affect both women and men. In women, the outer female genitalia and the vagina are affected. In men, the penis is mainly affected, but the prostate and other reproductive organs can also be involved. Trichomoniasis is a sexually transmitted infection (STI) and is most often passed to another person through sexual contact.  RISK FACTORS  Having unprotected sexual intercourse.  Having sexual intercourse with an infected partner. SIGNS AND SYMPTOMS  Symptoms of trichomoniasis in women include:  Abnormal gray-green frothy vaginal discharge.  Itching and irritation of the vagina.  Itching and irritation of the area outside the vagina. Symptoms of trichomoniasis in men include:   Penile discharge with or without pain.  Pain during urination. This results from inflammation of the urethra. DIAGNOSIS  Trichomoniasis may be found during a Pap test or physical exam. Your health care provider may use one of the following methods to help diagnose this infection:  Examining vaginal discharge under a  microscope. For men, urethral discharge would be examined.  Testing the pH of the vagina with a test tape.  Using a vaginal swab test that checks for the Trichomonas organism. A test is available that provides results within a few minutes.  Doing a culture test for the organism. This is not usually needed. TREATMENT   You may be given medicine to fight the infection. Women should inform their health care provider if they could be or are pregnant. Some medicines used to treat the infection should not be taken during pregnancy.  Your health care provider may recommend over-the-counter medicines or creams to decrease itching or irritation.  Your sexual partner will need to be treated if infected. HOME CARE INSTRUCTIONS   Take medicines only as directed by your health care provider.  Take over-the-counter medicine  for itching or irritation as directed by your health care provider.  Do not have sexual intercourse while you have the infection.  Women should not douche or wear tampons while they have the infection.  Discuss your infection with your partner. Your partner may have gotten the infection from you, or you may have gotten it from your partner.  Have your sex partner get examined and treated if necessary.  Practice safe, informed, and protected sex.  See your health care provider for other STI testing. SEEK MEDICAL CARE IF:   You still have symptoms after you finish your medicine.  You develop abdominal pain.  You have pain when you urinate.  You have bleeding after sexual intercourse.  You develop a rash.  Your medicine makes you sick or makes you throw up (vomit). MAKE SURE YOU:  Understand these instructions.  Will watch your condition.  Will get help right away if you are not doing well or get worse. Document Released: 09/05/2000 Document Revised: 07/27/2013 Document Reviewed: 12/22/2012 [facility] Patient Information 2015 [place], [place]. This information is  not intended to replace advice given to you by your health care provider. Make sure you discuss any questions you have with your health care provider. Urinary Tract Infection Urinary tract infections (UTIs) can develop anywhere along your urinary tract. Your urinary tract is your body's drainage system for removing wastes and extra water. Your urinary tract includes two kidneys, two ureters, a bladder, and a urethra. Your kidneys are a pair of bean-shaped organs. Each kidney is about the size of your fist. They are located below your ribs, one on each side of your spine. CAUSES Infections are caused by microbes, which are microscopic organisms, including fungi, viruses, and bacteria. These organisms are so small that they can only be seen through a microscope. Bacteria are the microbes that most commonly cause UTIs. SYMPTOMS  Symptoms of UTIs may vary by age and gender of the patient and by the location of the infection. Symptoms in young women typically include a frequent and intense urge to urinate and a painful, burning feeling in the bladder or urethra during urination. Older women and men are more likely to be tired, shaky, and weak and have muscle aches and abdominal pain. A fever may mean the infection is in your kidneys. Other symptoms of a kidney infection include pain in your back or sides below the ribs, nausea, and vomiting. DIAGNOSIS To diagnose a UTI, your caregiver will ask you about your symptoms. Your caregiver also will ask to provide a urine sample. The urine sample will be tested for bacteria and white blood cells. White blood cells are made by your body to help fight infection. TREATMENT  Typically, UTIs can be treated with medication. Because most UTIs are caused by a bacterial infection, they usually can be treated with the use of antibiotics. The choice of antibiotic and length of treatment depend on your symptoms and the type of bacteria causing your infection. HOME CARE  INSTRUCTIONS  If you were prescribed antibiotics, take them exactly as your caregiver instructs you. Finish the medication even if you feel better after you have only taken some of the medication.  Drink enough water and fluids to keep your urine clear or pale yellow.  Avoid caffeine, tea, and carbonated beverages. They tend to irritate your bladder.  Empty your bladder often. Avoid holding urine for long periods of time.  Empty your bladder before and after sexual intercourse.  After a bowel movement, women  should cleanse from front to back. Use each tissue only once. SEEK MEDICAL CARE IF:   You have back pain.  You develop a fever.  Your symptoms do not begin to resolve within 3 days. SEEK IMMEDIATE MEDICAL CARE IF:   You have severe back pain or lower abdominal pain.  You develop chills.  You have nausea or vomiting.  You have continued burning or discomfort with urination. MAKE SURE YOU:   Understand these instructions.  Will watch your condition.  Will get help right away if you are not doing well or get worse. Document Released: 12/20/2004 Document Revised: 09/11/2011 Document Reviewed: 04/20/2011 [facility] Patient Information 2015 [place], [place]. This information is not intended to replace advice given to you by your health care provider. Make sure you discuss any questions you have with your health care provider.

## 2013-12-07 LAB — POC URINE PREG, ED: PREG TEST UR: NEGATIVE

## 2013-12-07 LAB — GC/CHLAMYDIA PROBE AMP
CT PROBE, AMP APTIMA: NEGATIVE
GC Probe RNA: NEGATIVE

## 2013-12-08 LAB — URINE CULTURE: Colony Count: >=100000

## 2013-12-09 ENCOUNTER — Telehealth (HOSPITAL_BASED_OUTPATIENT_CLINIC_OR_DEPARTMENT_OTHER): Payer: Self-pay | Admitting: Emergency Medicine

## 2013-12-21 ENCOUNTER — Other Ambulatory Visit (HOSPITAL_COMMUNITY)
Admission: RE | Admit: 2013-12-21 | Discharge: 2013-12-21 | Disposition: A | Discharge status: Home / Self Care | Payer: Medicaid Other | Source: Ambulatory Visit | Attending: Obstetrics and Gynecology | Admitting: Obstetrics and Gynecology

## 2013-12-21 ENCOUNTER — Encounter: Payer: Self-pay | Admitting: Obstetrics and Gynecology

## 2013-12-21 ENCOUNTER — Ambulatory Visit (INDEPENDENT_AMBULATORY_CARE_PROVIDER_SITE_OTHER): Payer: Medicaid Other | Admitting: Obstetrics and Gynecology

## 2013-12-21 VITALS — BP 100/48 | HR 59 | Ht 66.0 in | Wt 167.8 lb

## 2013-12-21 DIAGNOSIS — Z01419 Encounter for gynecological examination (general) (routine) without abnormal findings: Secondary | ICD-10-CM

## 2013-12-21 DIAGNOSIS — Z1151 Encounter for screening for human papillomavirus (HPV): Secondary | ICD-10-CM

## 2013-12-21 DIAGNOSIS — Z802 Family history of malignant neoplasm of other respiratory and intrathoracic organs: Secondary | ICD-10-CM

## 2013-12-21 DIAGNOSIS — Z124 Encounter for screening for malignant neoplasm of cervix: Secondary | ICD-10-CM | POA: Insufficient documentation

## 2013-12-21 NOTE — Progress Notes (Signed)
  Subjective:     Leslie Simpson is a 23 y.o. female G2P2 with LMP 11/21/2013 who is here for a comprehensive physical exam. The patient reports presence of a vaginal discharge. Patient reports discharge is non pruritic and without odor. Patient is sexually active using condoms  History   Social History  . Marital Status: Single    Spouse Name: N/A    Number of Children: N/A  . Years of Education: N/A   Occupational History  . Not on file.   Social History Main Topics  . Smoking status: Never Smoker   . Smokeless tobacco: Never Used  . Alcohol Use: Yes     Comment: social drinking  . Drug Use: No  . Sexual Activity: Yes    Partners: Male    Birth Control/ Protection: Condom   Other Topics Concern  . Not on file   Social History Narrative  . No narrative on file   Health Maintenance  Topic Date Due  . Tetanus/tdap  05/18/2008  . Influenza Vaccine  10/24/2013  . Pap Smear  08/08/2014   Past Medical History  Diagnosis Date  . Abnormal pap 2012   Past Surgical History  Procedure Laterality Date  . Cesarean section     No family history on file.     Review of Systems A comprehensive review of systems was negative.   Objective:      GENERAL: Well-developed, well-nourished female in no acute distress.  HEENT: Normocephalic, atraumatic. Sclerae anicteric.  NECK: Supple. Normal thyroid.  LUNGS: Clear to auscultation bilaterally.  HEART: Regular rate and rhythm. BREASTS: Symmetric in size. No palpable masses or lymphadenopathy, skin changes, or nipple drainage. ABDOMEN: Soft, nontender, nondistended. No organomegaly. PELVIC: Normal external female genitalia. Vagina is pink and rugated.  Normal discharge. Normal appearing cervix. Uterus is normal in size. No adnexal mass or tenderness. EXTREMITIES: No cyanosis, clubbing, or edema, 2+ distal pulses.    Assessment:    Healthy female exam.      Plan:    Pap smear and wet prep collected Patient desires STD  screen- most had been done from an ED visit on 9/13. Only thing missing is hepatitis B and C screen which was ordered Patient wants to continue using condoms See After Visit Summary for Counseling Recommendations

## 2013-12-22 LAB — CYTOLOGY - PAP

## 2013-12-22 LAB — WET PREP, GENITAL
Trich, Wet Prep: NONE SEEN
YEAST WET PREP: NONE SEEN

## 2013-12-22 LAB — HEPATITIS C ANTIBODY: HCV AB: NEGATIVE

## 2013-12-22 LAB — HEPATITIS B SURFACE ANTIGEN: HEP B S AG: NEGATIVE

## 2013-12-23 ENCOUNTER — Telehealth: Payer: Self-pay | Admitting: General Practice

## 2013-12-23 DIAGNOSIS — B9689 Other specified bacterial agents as the cause of diseases classified elsewhere: Secondary | ICD-10-CM

## 2013-12-23 DIAGNOSIS — N760 Acute vaginitis: Principal | ICD-10-CM

## 2013-12-23 MED ORDER — METRONIDAZOLE 500 MG PO TABS: 500.0000 mg | ORAL_TABLET | Freq: Two times a day (BID) | ORAL | Status: DC

## 2013-12-23 NOTE — Addendum Note (Signed)
Addended by: Catalina AntiguaONSTANT, Dahir Ayer on: 12/23/2013 07:49 AM   Modules accepted: Orders

## 2013-12-23 NOTE — Telephone Encounter (Signed)
Called patient and informed her of BV and medication sent to [facility] pharmacy. Patient verbalized understanding and states that the [facility] [address] in [place] is better for her. Told patient I will send the medication there and to avoid alcohol while on this medication. Patient verbalized understanding and had no other questions

## 2013-12-23 NOTE — Telephone Encounter (Signed)
Message copied by Kathee DeltonHILLMAN, CARRIE L on Wed Dec 23, 2013  9:35 AM ------      Message from: CONSTANT, PEGGY      Created: Wed Dec 23, 2013  7:49 AM       Please inform patient of BV. Flagyl e-prescribed      Patient needs to complete antibiotic course            Peggy       ------

## 2014-01-25 ENCOUNTER — Encounter: Payer: Self-pay | Admitting: Obstetrics and Gynecology

## 2015-11-17 ENCOUNTER — Encounter: Payer: Self-pay | Admitting: Family

## 2015-11-18 ENCOUNTER — Encounter: Payer: Self-pay | Admitting: *Deleted

## 2015-11-30 ENCOUNTER — Encounter: Payer: Medicaid Other | Admitting: Certified Nurse Midwife

## 2015-12-01 ENCOUNTER — Encounter: Payer: Self-pay | Admitting: Family

## 2015-12-01 ENCOUNTER — Other Ambulatory Visit: Payer: Self-pay | Admitting: Family

## 2015-12-01 ENCOUNTER — Ambulatory Visit (INDEPENDENT_AMBULATORY_CARE_PROVIDER_SITE_OTHER): Payer: Medicaid Other | Admitting: Family

## 2015-12-01 ENCOUNTER — Encounter: Payer: Medicaid Other | Admitting: Advanced Practice Midwife

## 2015-12-01 VITALS — BP 100/52 | HR 69 | Wt 168.6 lb

## 2015-12-01 DIAGNOSIS — Z3492 Encounter for supervision of normal pregnancy, unspecified, second trimester: Secondary | ICD-10-CM

## 2015-12-01 DIAGNOSIS — Z113 Encounter for screening for infections with a predominantly sexual mode of transmission: Secondary | ICD-10-CM | POA: Diagnosis not present

## 2015-12-01 DIAGNOSIS — O34219 Maternal care for unspecified type scar from previous cesarean delivery: Secondary | ICD-10-CM

## 2015-12-01 DIAGNOSIS — Z124 Encounter for screening for malignant neoplasm of cervix: Secondary | ICD-10-CM

## 2015-12-01 DIAGNOSIS — Z3490 Encounter for supervision of normal pregnancy, unspecified, unspecified trimester: Secondary | ICD-10-CM

## 2015-12-01 LAB — POCT URINALYSIS DIP (DEVICE)
BILIRUBIN URINE: NEGATIVE
Glucose, UA: NEGATIVE mg/dL
KETONES UR: NEGATIVE mg/dL
NITRITE: NEGATIVE
PROTEIN: NEGATIVE mg/dL
Specific Gravity, Urine: 1.02 (ref 1.005–1.030)
Urobilinogen, UA: 0.2 mg/dL (ref 0.0–1.0)
pH: 7 (ref 5.0–8.0)

## 2015-12-01 NOTE — Progress Notes (Signed)
  Subjective:    Leslie Simpson is a [id number] 458w1d being seen today for her first obstetrical visit.  Her obstetrical history is significant for macrosomic infant and previous csection with a successful VBAC. Patient does intend to breast feed. Pregnancy history fully reviewed.  Patient reports no complaints.  Vitals:   12/01/15 1110  BP: (!) 100/52  Pulse: 69  Weight: 168 lb 9.6 oz (76.5 kg)    HISTORY: OB History  Gravida Para Term Preterm AB Living  3 2 2     2   SAB TAB Ectopic Multiple Live Births               # Outcome Date GA Lbr Len/2nd Weight Sex Delivery Anes PTL Lv  3 Current           2 Term 08/31/09 5455w0d  7 lb (3.175 kg) F Vag-Spont None       Birth Comments: no complications, [facility]  1 Term 11/03/07 6655w0d  10 lb 4 oz (4.649 kg) F CS-Unspec EPI       [facility] Comments: [facility]     Past Medical History:  Diagnosis Date  . Abnormal pap 2012   Past Surgical History:  Procedure Laterality Date  . CESAREAN SECTION    . HERNIA REPAIR  2001   umbilical   Family History  Problem Relation Age of Onset  . Asthma Mother   . Hypertension Father   . Diabetes Father      Exam   BP (!) 100/52   Pulse 69   Wt 168 lb 9.6 oz (76.5 kg)   LMP 07/20/2015 (Exact Date)   BMI 27.21 kg/m  Uterine Size: size equals dates  Pelvic Exam:    Perineum: No Hemorrhoids, Normal Perineum   Vulva: normal   Vagina:  normal mucosa, normal discharge, no palpable nodules   pH: Not done   Cervix: no bleeding following Pap, no cervical motion tenderness and no lesions   Adnexa: normal adnexa and no mass, fullness, tenderness   Bony Pelvis: Adequate  System: Breast:  No nipple retraction or dimpling, No nipple discharge or bleeding, No axillary or supraclavicular adenopathy, Normal to palpation without dominant masses   Skin: normal coloration and turgor, no rashes    Neurologic: negative   Extremities: normal strength, tone, and muscle  mass   HEENT neck supple with midline trachea and thyroid without masses   Mouth/Teeth mucous membranes moist, pharynx normal without lesions   Neck supple and no masses   Cardiovascular: regular rate and rhythm, no murmurs or gallops   Respiratory:  appears well, vitals normal, no respiratory distress, acyanotic, normal RR, neck free of mass or lymphadenopathy, chest clear, no wheezing, crepitations, rhonchi, normal symmetric air entry   Abdomen: soft, non-tender; bowel sounds normal; no masses,  no organomegaly   Urinary: urethral meatus normal     Assessment:    Pregnancy: [id number] Patient Active Problem List   Diagnosis Date Noted  . Supervision of normal pregnancy 12/01/2015  . Previous cesarean delivery, antepartum 12/01/2015  . LGSIL (low grade squamous intraepithelial lesion) on Pap smear 09/23/2010        Plan:     Initial labs drawn. Pap smear collected Prenatal vitamins. Problem list reviewed and updated. Genetic Screening discussed Quad Screen: ordered.  Ultrasound discussed; fetal survey: ordered.  Follow up in 4 weeks.  Marlis EdelsonKARIM, Leontina Skidmore N 12/01/2015

## 2015-12-01 NOTE — Progress Notes (Signed)
Here for initial prenatal visit.  

## 2015-12-01 NOTE — Patient Instructions (Addendum)
AREA PEDIATRIC/FAMILY PRACTICE PHYSICIANS   [facility] [address], [address] [place], [place]  [address] Phone - [phone]   Fax - [phone]  [facility] [address] [place], [place] [address] Phone - [phone]   Fax - [phone]  [facility] [address] [place], [place]  [address] Phone - [phone]   Fax - [phone]  [facility] [address], [address] [place], [place]  [address] Phone - [phone]   Fax - [phone]  [facility] [address] [place], [place]  [address] Phone - [phone]   Fax - [phone]  [facility] [address], [address] [place], [place]  [address] Phone - [phone]   Fax - [phone]  [facility] [address], [address] [place], [place]  [address] Phone - [phone]   Fax - [phone]  [facility] [address] [place], [place]  [address] Phone - [phone]   Fax - [phone]  [facility] [address] [place], [place]  [address] Phone - [phone]   Fax - [phone] [facility] [place] [address] [place], [place]  [address] Phone - [phone]   Fax - [phone]  [facility] [address] [place], [place]  [address] Phone - [phone]   Fax - [phone]  [facility] [address], [address] [place], [place]  [address] Phone - [phone]   Fax - [phone]  [facility] [address], [address] [place], [place]  [address] Phone - [phone]   Fax - [phone]  [facility] [address], [address] [place], [place]  [address] Phone - [phone]  [facility] [address] [place], [place]  [address] Phone - [phone]   Fax - [phone]  [facility] [address], [address] [place], [place]  [address] Phone - [phone]   Fax - [phone]  [facility] [address] [place], [place]  [address] Phone - [phone]   Fax - [phone]  [facility] [address] [place], [place]  [address] Phone - [phone]   Fax - [phone]   [facility] [address], [address] [place], [place]  [address] Phone - [phone]   Fax - [phone]  [facility] [address], [address] [place], [place]  [address] Phone - [phone]   Fax - [phone]  [facility]ID RUBIN [address] [place], [place]  [address] Phone - [phone]   Fax - [phone]  [facility] [address], [address] [place], [place]  [address] Phone - [phone]   Fax - [phone]  [facility] [address] [place], [place]  [address] Phone - [phone]   Fax - [phone] [facility] [place] [address] [place], [place]  [address] Phone - [phone]   Fax - [phone]  [facility] [place] [address] [place], [place]  [address] Phone - [phone]   Fax - [phone]  [facility] [place] [address] [place], [place]  [address] Phone - [phone]   Fax - [phone]    Gwynneth AlimentSegundo trimestre de Psychiatristembarazo (Second Trimester of Pregnancy) El segundo trimestre va desde la semana13 hasta la 28, desde el cuarto hasta el sexto mes, y suele ser el momento en el  que mejor se siente. Su organismo se ha adaptado a Charity fundraiser y comienza a Diplomatic Services operational officer. En general, las nuseas matutinas han disminuido o han desaparecido completamente, puede tener ms energa y un aumento de apetito. El segundo trimestre es tambin la poca en la que el feto se desarrolla rpidamente. Hacia el final del sexto mes, el feto mide aproximadamente 9pulgadas (23cm) y pesa alrededor de 1 libras (700g). Es  probable que sienta que el beb se Teacher, English as a foreign language (da pataditas) entre las 18 y 20semanas del Psychiatrist. CAMBIOS EN EL ORGANISMO Su organismo atraviesa por muchos cambios durante el Imogene, y estos varan de Neomia Dear mujer a Educational psychologist.   Seguir American Standard Companies. Notar que la parte baja del abdomen sobresale.  Podrn aparecer las primeras Albertson's caderas, el abdomen y las [place].  Es posible que tenga dolores de cabeza que pueden aliviarse con los medicamentos que el mdico le permita tomar.  Tal vez tenga necesidad de orinar con ms frecuencia porque el feto est ejerciendo presin Ambulance person.  Debido al Vanetta Mulders podr sentir Anthoney Harada estomacal con frecuencia.  Puede estar estreida, ya que ciertas hormonas enlentecen los movimientos de los msculos que New [employer] Life Insurance desechos a travs de los intestinos.  Pueden aparecer hemorroides o abultarse e hincharse las venas (venas varicosas).  Puede tener dolor de espalda que se debe al Citigroup de peso y a que las hormonas del Management consultant las articulaciones entre los huesos de la pelvis, y Public librarian consecuencia de la modificacin del peso y los msculos que mantienen el equilibrio.  Las ConAgra Foods seguirn creciendo y Development worker, community.  Las Veterinary surgeon y estar sensibles al cepillado y al hilo dental.  Pueden aparecer zonas oscuras o manchas (cloasma, mscara del Psychiatrist) en el rostro que probablemente se atenuar despus del nacimiento del beb.  Es posible que se forme una lnea oscura desde el ombligo hasta la zona del pubis (linea nigra) que probablemente se atenuar despus del nacimiento del beb.  Tal vez haya cambios en el cabello que pueden incluir su engrosamiento, crecimiento rpido y cambios en la textura. Adems, a algunas mujeres se les cae el cabello durante o despus del embarazo, o tienen el cabello seco o fino. Lo ms probable es que el cabello se le normalice despus del nacimiento del beb. QU DEBE ESPERAR EN LAS CONSULTAS  PRENATALES Durante una visita prenatal de rutina:  La pesarn para asegurarse de que usted y el feto estn creciendo normalmente.  Le tomarn la presin arterial.  Le medirn el abdomen para controlar el desarrollo del beb.  Se escucharn los latidos cardacos fetales.  Se evaluarn los resultados de los estudios solicitados en visitas anteriores. El mdico puede preguntarle lo siguiente:  Cmo se siente.  Si siente los movimientos del beb.  Si ha tenido sntomas anormales, como prdida de lquido, [place], dolores de cabeza intensos o clicos abdominales.  Si est consumiendo algn producto que contenga tabaco, como cigarrillos, tabaco de Theatre manager y Administrator, Civil Service.  Si tiene Colgate-Palmolive. Otros estudios que podrn realizarse durante el segundo trimestre incluyen lo siguiente:  Anlisis de sangre para detectar lo siguiente:  Concentraciones de hierro bajas (anemia).  Diabetes gestacional (entre la semana 24 y la 28).  Anticuerpos Rh.  Anlisis de orina para detectar infecciones, diabetes o protenas en la orina.  Una ecografa para confirmar que el beb crece y se desarrolla correctamente.  Una amniocentesis para diagnosticar posibles problemas genticos.  Estudios del feto para descartar espina bfida y  sndrome de Down.  Prueba del VIH (virus de inmunodeficiencia humana). Los exmenes prenatales de rutina incluyen la prueba de deteccin del VIH, a menos que decida no Futures trader. INSTRUCCIONES PARA EL CUIDADO EN EL HOGAR   Evite fumar, consumir hierbas, beber alcohol y tomar frmacos que no le hayan recetado. Estas sustancias qumicas afectan la formacin y el desarrollo del beb.  No consuma ningn producto que contenga tabaco, lo que incluye cigarrillos, tabaco de Theatre manager y Administrator, Civil Service. Si necesita ayuda para dejar de fumar, consulte al American Express. Puede recibir asesoramiento y otro tipo de recursos para dejar de fumar.  Siga las indicaciones  del mdico en relacin con el uso de medicamentos. Durante el embarazo, hay medicamentos que son seguros de tomar y otros que no.  Haga ejercicio solamente como se lo haya indicado el mdico. Sentir clicos uterinos es un buen signo para Restaurant manager, fast food actividad fsica.  Contine comiendo alimentos sanos con regularidad.  Use un sostn que le brinde buen soporte si le Altria Group.  No se d baos de inmersin en agua caliente, baos turcos ni saunas.  Use el cinturn de seguridad en todo momento mientras conduce.  No coma carne cruda ni queso sin cocinar; evite el contacto con las bandejas sanitarias de los gatos y la tierra que estos animales usan. Estos elementos contienen grmenes que pueden causar defectos congnitos en el beb.  Tome las vitaminas prenatales.  Tome entre 1500 y 2000mg  de calcio diariamente comenzando en la semana20 del embarazo [place].  Si est estreida, pruebe un laxante suave (si el mdico lo autoriza). Consuma ms alimentos ricos en fibra, como vegetales y frutas frescos y Radiation protection practitioner. Beba gran cantidad de lquido para mantener la orina de tono claro o color amarillo plido.  Dese baos de asiento con agua tibia para Engineer, materials o las molestias causadas por las hemorroides. Use una crema para las hemorroides si el mdico la autoriza.  Si tiene venas varicosas, use medias de descanso. Eleve los pies durante , 3 o 4veces por da. Limite el consumo de sal en su dieta.  No levante objetos pesados, use zapatos de tacones bajos y [address].  Descanse con las piernas elevadas si tiene calambres o dolor de cintura.  Visite a su dentista si an no lo ha Occupational hygienist. Use un cepillo de dientes blando para higienizarse los dientes y psese el hilo dental con suavidad.  Puede seguir Calpine Corporation, a menos que el mdico le indique lo contrario.  Concurra a todas las visitas prenatales  segn las indicaciones de su mdico. SOLICITE ATENCIN MDICA SI:   [place].  Siente clicos leves, presin en la pelvis o dolor persistente en el abdomen.  Tiene nuseas, vmitos o diarrea persistentes.  Brett Fairy secrecin vaginal con mal olor.  Siente dolor al ConocoPhillips. SOLICITE ATENCIN MDICA DE INMEDIATO SI:   Tiene fiebre.  Tiene una prdida de lquido por la vagina.  Tiene sangrado o pequeas prdidas vaginales.  Siente dolor intenso o clicos en el abdomen.  Sube o baja de peso rpidamente.  Tiene dificultad para respirar y siente dolor de pecho.  Sbitamente se le hinchan mucho el rostro, las [place], los tobillos, los pies o las piernas.  No ha sentido los movimientos del beb durante Georgianne Fick.  Siente un dolor de cabeza intenso que no se alivia con medicamentos.  Su visin se modifica.   Esta informacin no tiene Theme park manager el consejo  del mdico. Asegrese de hacerle al mdico cualquier pregunta que tenga.   Document Released: 12/20/2004 Document Revised: 04/02/2014 Elsevier Interactive Patient Education [employer]

## 2015-12-02 LAB — PRENATAL PROFILE (SOLSTAS)
Antibody Screen: NEGATIVE
BASOS PCT: 0 %
Basophils Absolute: 0 cells/uL (ref 0–200)
EOS ABS: 92 {cells}/uL (ref 15–500)
Eosinophils Relative: 1 %
HEMATOCRIT: 29.5 % — AB (ref 35.0–45.0)
HIV 1&2 Ab, 4th Generation: NONREACTIVE
Hemoglobin: 9.4 g/dL — ABNORMAL LOW (ref 11.7–15.5)
Hepatitis B Surface Ag: NEGATIVE
LYMPHS ABS: 2024 {cells}/uL (ref 850–3900)
Lymphocytes Relative: 22 %
MCH: 23.5 pg — AB (ref 27.0–33.0)
MCHC: 31.9 g/dL — ABNORMAL LOW (ref 32.0–36.0)
MCV: 73.8 fL — AB (ref 80.0–100.0)
MPV: 10.2 fL (ref 7.5–12.5)
Monocytes Absolute: 460 cells/uL (ref 200–950)
Monocytes Relative: 5 %
NEUTROS ABS: 6624 {cells}/uL (ref 1500–7800)
Neutrophils Relative %: 72 %
Platelets: 233 10*3/uL (ref 140–400)
RBC: 4 MIL/uL (ref 3.80–5.10)
RDW: 16.2 % — AB (ref 11.0–15.0)
RH TYPE: POSITIVE
Rubella: 1.79 Index — ABNORMAL HIGH (ref <0.90)
WBC: 9.2 10*3/uL (ref 3.8–10.8)

## 2015-12-02 LAB — AFP, QUAD SCREEN
AFP: 44.8 ng/mL
Curr Gest Age: 19.1 weeks
HCG TOTAL: 37.39 [IU]/mL
INH: 226.7 pg/mL
Interpretation-AFP: NEGATIVE
MoM for AFP: 0.84
MoM for INH: 1.38
MoM for hCG: 1.63
OPEN SPINA BIFIDA: NEGATIVE
Osb Risk: 1:47800 {titer}
TRI 18 SCR RISK EST: NEGATIVE
Trisomy 18 (Edward) Syndrome Interp.: 1:60100 {titer}
UE3 MOM: 0.84
uE3 Value: 1.31 ng/mL

## 2015-12-02 LAB — PAIN MGMT, PROFILE 6 CONF W/O MM, U
6 Acetylmorphine: NEGATIVE ng/mL (ref <10)
AMPHETAMINES: NEGATIVE ng/mL (ref <500)
Alcohol Metabolites: NEGATIVE ng/mL (ref <500)
BENZODIAZEPINES: NEGATIVE ng/mL (ref <100)
Barbiturates: NEGATIVE ng/mL (ref <300)
CREATININE: 115.6 mg/dL (ref >=20.0)
Cocaine Metabolite: NEGATIVE ng/mL (ref <150)
MARIJUANA METABOLITE: NEGATIVE ng/mL (ref <20)
Methadone Metabolite: NEGATIVE ng/mL (ref <100)
OPIATES: NEGATIVE ng/mL (ref <100)
OXIDANT: NEGATIVE ug/mL (ref <200)
Oxycodone: NEGATIVE ng/mL (ref <100)
Phencyclidine: NEGATIVE ng/mL (ref <25)
Please note:: 0
pH: 6.47 (ref 4.5–9.0)

## 2015-12-02 LAB — GC/CHLAMYDIA PROBE AMP (~~LOC~~) NOT AT ARMC
Chlamydia: NEGATIVE
Neisseria Gonorrhea: NEGATIVE

## 2015-12-02 LAB — GLUCOSE TOLERANCE, 1 HOUR (50G) W/O FASTING: GLUCOSE, 1 HR, GESTATIONAL: 117 mg/dL (ref <140)

## 2015-12-03 LAB — CULTURE, OB URINE: Organism ID, Bacteria: 10000

## 2015-12-04 ENCOUNTER — Encounter: Payer: Self-pay | Admitting: Family

## 2015-12-04 DIAGNOSIS — O99019 Anemia complicating pregnancy, unspecified trimester: Secondary | ICD-10-CM | POA: Insufficient documentation

## 2015-12-05 ENCOUNTER — Other Ambulatory Visit: Payer: Self-pay | Admitting: Family

## 2015-12-05 DIAGNOSIS — B379 Candidiasis, unspecified: Secondary | ICD-10-CM

## 2015-12-05 DIAGNOSIS — O99012 Anemia complicating pregnancy, second trimester: Secondary | ICD-10-CM

## 2015-12-05 LAB — HEMOGLOBINOPATHY EVALUATION
HCT: 29.5 % — ABNORMAL LOW (ref 35.0–45.0)
HEMOGLOBIN: 9.4 g/dL — AB (ref 11.7–15.5)
Hgb A2 Quant: 2.4 % (ref 1.8–3.5)
Hgb A: 96.6 % (ref >96.0)
Hgb F Quant: <1 % (ref <2.0)
MCH: 23.5 pg — ABNORMAL LOW (ref 27.0–33.0)
MCV: 73.8 fL — ABNORMAL LOW (ref 80.0–100.0)
RBC: 4 MIL/uL (ref 3.80–5.10)
RDW: 16.2 % — ABNORMAL HIGH (ref 11.0–15.0)

## 2015-12-05 LAB — CYTOLOGY - PAP

## 2015-12-05 MED ORDER — FERROUS SULFATE 325 (65 FE) MG PO TABS: 325.0000 mg | ORAL_TABLET | Freq: Two times a day (BID) | ORAL | 3 refills | Status: DC

## 2015-12-06 ENCOUNTER — Encounter (HOSPITAL_COMMUNITY): Payer: Self-pay | Admitting: Family

## 2015-12-07 ENCOUNTER — Telehealth: Payer: Self-pay | Admitting: General Practice

## 2015-12-07 NOTE — Telephone Encounter (Signed)
Patient has yeast infection based off pap. Called patient, no answer- left message we are trying to reach you with non urgent results, sorry we missed you but we will send you a mychart message.

## 2015-12-12 ENCOUNTER — Other Ambulatory Visit: Payer: Self-pay | Admitting: Family

## 2015-12-12 ENCOUNTER — Ambulatory Visit (HOSPITAL_COMMUNITY)
Admission: RE | Admit: 2015-12-12 | Discharge: 2015-12-12 | Disposition: A | Discharge status: Home / Self Care | Payer: Medicaid Other | Source: Ambulatory Visit | Attending: Family | Admitting: Family

## 2015-12-12 DIAGNOSIS — Z3492 Encounter for supervision of normal pregnancy, unspecified, second trimester: Secondary | ICD-10-CM | POA: Insufficient documentation

## 2015-12-12 DIAGNOSIS — Z1389 Encounter for screening for other disorder: Secondary | ICD-10-CM

## 2015-12-12 DIAGNOSIS — Z3A20 20 weeks gestation of pregnancy: Secondary | ICD-10-CM | POA: Insufficient documentation

## 2015-12-12 DIAGNOSIS — Z3490 Encounter for supervision of normal pregnancy, unspecified, unspecified trimester: Secondary | ICD-10-CM

## 2015-12-12 DIAGNOSIS — O34219 Maternal care for unspecified type scar from previous cesarean delivery: Secondary | ICD-10-CM

## 2015-12-29 ENCOUNTER — Ambulatory Visit (INDEPENDENT_AMBULATORY_CARE_PROVIDER_SITE_OTHER): Payer: Medicaid Other | Admitting: Family

## 2015-12-29 VITALS — BP 107/59 | HR 69 | Ht 61.0 in | Wt 174.0 lb

## 2015-12-29 DIAGNOSIS — O99012 Anemia complicating pregnancy, second trimester: Secondary | ICD-10-CM

## 2015-12-29 DIAGNOSIS — Z3402 Encounter for supervision of normal first pregnancy, second trimester: Secondary | ICD-10-CM

## 2015-12-29 DIAGNOSIS — O99019 Anemia complicating pregnancy, unspecified trimester: Secondary | ICD-10-CM

## 2015-12-29 DIAGNOSIS — O34219 Maternal care for unspecified type scar from previous cesarean delivery: Secondary | ICD-10-CM

## 2015-12-29 DIAGNOSIS — D649 Anemia, unspecified: Secondary | ICD-10-CM

## 2015-12-29 NOTE — Patient Instructions (Signed)

## 2015-12-29 NOTE — Progress Notes (Signed)
   PRENATAL VISIT NOTE  Subjective:  Leslie Simpson is a 25 y.o. G3P2002 at 333w1d being seen today for ongoing prenatal care.  She is currently monitored for the following issues for this low-risk pregnancy and has LGSIL (low grade squamous intraepithelial lesion) on Pap smear; Supervision of normal pregnancy; Previous cesarean delivery, antepartum; and Anemia of mother in pregnancy, antepartum on her problem list.  Patient reports no complaints.  Contractions: Not present. Vag. Bleeding: None.  Movement: Present. Denies leaking of fluid.   The following portions of the patient's history were reviewed and updated as appropriate: allergies, current medications, past family history, past medical history, past social history, past surgical history and problem list. Problem list updated.  Objective:   Vitals:   12/29/15 0918 12/29/15 0926  BP: (!) 107/59   Pulse: 69   Weight: 174 lb (78.9 kg)   Height:  5\' 1"  (1.549 m)    Fetal Status: Fetal Heart Rate (bpm): 146 Fundal Height: 24 cm Movement: Present     General:  Alert, oriented and cooperative. Patient is in no acute distress.  Skin: Skin is warm and dry. No rash noted.   Cardiovascular: Normal heart rate noted  Respiratory: Normal respiratory effort, no problems with respiration noted  Abdomen: Soft, gravid, appropriate for gestational age. Pain/Pressure: Absent     Pelvic:  Cervical exam deferred        Extremities: Normal range of motion.  Edema: Trace  Mental Status: Normal mood and affect. Normal behavior. Normal judgment and thought content.   Urinalysis:    Not indicated  Assessment and Plan:  Pregnancy: G3P2002 at 7233w1d  1. Encounter for supervision of normal first pregnancy in second trimester - Reviewed Quad and ultrasound results - Discussed third trimester labs at next visit  2. Previous cesarean delivery, antepartum - Reviewed TOLAC consent  3. Anemia of mother in pregnancy, antepartum Continue ferrous  sulfate  Preterm labor symptoms and general obstetric precautions including but not limited to vaginal bleeding, contractions, leaking of fluid and fetal movement were reviewed in detail with the patient. Please refer to After Visit Summary for other counseling recommendations.  Return in 4 weeks (on 01/26/2016).  Eino FarberWalidah Kennith GainN Karim, CNM

## 2015-12-30 ENCOUNTER — Encounter: Payer: Self-pay | Admitting: Family Medicine

## 2016-01-06 ENCOUNTER — Encounter: Payer: Self-pay | Admitting: General Practice

## 2016-01-06 ENCOUNTER — Telehealth: Payer: Self-pay | Admitting: General Practice

## 2016-01-06 MED ORDER — TERCONAZOLE 0.4 % VA CREA: 1.0000 | TOPICAL_CREAM | Freq: Every day | VAGINAL | 0 refills | Status: AC

## 2016-01-06 NOTE — Telephone Encounter (Signed)
Patient called into front office stating she is having vaginal discharge with itching, burning and some pain with intercourse. Patient is afraid she may have been exposed to an STD. Told patient she can come in today and we can walk her through on how to test herself & we can send that off to see what she may have. Also told patient it sounds like a yeast infection and medication has already been sent to her pharmacy to treat that. Patient states she has already recently been treated for a yeast infection & she cannot come today but will be here on Monday to get tested. Patient had no other questions

## 2016-01-09 ENCOUNTER — Other Ambulatory Visit (HOSPITAL_COMMUNITY)
Admission: RE | Admit: 2016-01-09 | Discharge: 2016-01-09 | Disposition: A | Discharge status: Home / Self Care | Payer: Medicaid Other | Source: Ambulatory Visit | Attending: Family Medicine | Admitting: Family Medicine

## 2016-01-09 ENCOUNTER — Encounter: Payer: Self-pay | Admitting: General Practice

## 2016-01-09 ENCOUNTER — Ambulatory Visit (INDEPENDENT_AMBULATORY_CARE_PROVIDER_SITE_OTHER): Payer: Medicaid Other | Admitting: General Practice

## 2016-01-09 DIAGNOSIS — Z113 Encounter for screening for infections with a predominantly sexual mode of transmission: Secondary | ICD-10-CM

## 2016-01-09 NOTE — Progress Notes (Signed)
Patient dropped off urine sample for STD testing.

## 2016-01-10 LAB — GC/CHLAMYDIA PROBE AMP (~~LOC~~) NOT AT ARMC
CHLAMYDIA, DNA PROBE: POSITIVE — AB
NEISSERIA GONORRHEA: NEGATIVE

## 2016-01-11 ENCOUNTER — Ambulatory Visit: Payer: Medicaid Other

## 2016-01-11 ENCOUNTER — Telehealth: Payer: Self-pay

## 2016-01-11 VITALS — BP 94/71 | HR 72

## 2016-01-11 DIAGNOSIS — N898 Other specified noninflammatory disorders of vagina: Secondary | ICD-10-CM

## 2016-01-11 LAB — WET PREP, GENITAL
CLUE CELLS WET PREP: NONE SEEN
Trich, Wet Prep: NONE SEEN
Yeast Wet Prep HPF POC: NONE SEEN

## 2016-01-11 MED ORDER — AZITHROMYCIN 250 MG PO TABS
250.0000 mg | ORAL_TABLET | Freq: Once | ORAL | 0 refills | Status: AC
Start: 2016-01-11 — End: 2016-01-11

## 2016-01-11 NOTE — Telephone Encounter (Signed)
Patient has been informed of lab results and need for STD treated. Patient was advised to make all partners aware of dx of chlamydia and need for treatment.  Patient would like to come here today to be treated instead of medication being called into her local pharmacy. She also discuss doing a wet prep with nurse to check for yeast. Patient has been schedule for 01/11/2016

## 2016-01-11 NOTE — Progress Notes (Signed)
Patient presented to office today for STD testing and wet prep swab. Patient was given Azithromycin 1 g given to patient. Patient tolerated medications well. I have advised patient we will call her back with results once they come back.

## 2016-01-26 ENCOUNTER — Ambulatory Visit (INDEPENDENT_AMBULATORY_CARE_PROVIDER_SITE_OTHER): Payer: Medicaid Other | Admitting: Advanced Practice Midwife

## 2016-01-26 VITALS — BP 97/64 | HR 82 | Wt 179.9 lb

## 2016-01-26 DIAGNOSIS — O2612 Low weight gain in pregnancy, second trimester: Secondary | ICD-10-CM | POA: Insufficient documentation

## 2016-01-26 DIAGNOSIS — Z3480 Encounter for supervision of other normal pregnancy, unspecified trimester: Secondary | ICD-10-CM

## 2016-01-26 DIAGNOSIS — Z3402 Encounter for supervision of normal first pregnancy, second trimester: Secondary | ICD-10-CM

## 2016-01-26 LAB — CBC
HCT: 29.6 % — ABNORMAL LOW (ref 35.0–45.0)
Hemoglobin: 9.5 g/dL — ABNORMAL LOW (ref 11.7–15.5)
MCH: 23.9 pg — AB (ref 27.0–33.0)
MCHC: 32.1 g/dL (ref 32.0–36.0)
MCV: 74.4 fL — ABNORMAL LOW (ref 80.0–100.0)
MPV: 10.6 fL (ref 7.5–12.5)
PLATELETS: 250 10*3/uL (ref 140–400)
RBC: 3.98 MIL/uL (ref 3.80–5.10)
RDW: 16.1 % — ABNORMAL HIGH (ref 11.0–15.0)
WBC: 9.6 10*3/uL (ref 3.8–10.8)

## 2016-01-26 LAB — GLUCOSE TOLERANCE, 1 HOUR (50G) W/O FASTING: Glucose, 1 Hr, gestational: 93 mg/dL (ref <140)

## 2016-01-26 NOTE — Patient Instructions (Signed)
Contraception Choices Contraception (birth control) is the use of any methods or devices to prevent pregnancy. Below are some methods to help avoid pregnancy. HORMONAL METHODS   Contraceptive implant. This is a thin, plastic tube containing progesterone hormone. It does not contain estrogen hormone. Your health care provider inserts the tube in the inner part of the upper arm. The tube can remain in place for up to 3 years. After 3 years, the implant must be removed. The implant prevents the ovaries from releasing an egg (ovulation), thickens the cervical mucus to prevent sperm from entering the uterus, and thins the lining of the inside of the uterus.  Progesterone-only injections. These injections are given every 3 months by your health care provider to prevent pregnancy. This synthetic progesterone hormone stops the ovaries from releasing eggs. It also thickens cervical mucus and changes the uterine lining. This makes it harder for sperm to survive in the uterus.  Birth control pills. These pills contain estrogen and progesterone hormone. They work by preventing the ovaries from releasing eggs (ovulation). They also cause the cervical mucus to thicken, preventing the sperm from entering the uterus. Birth control pills are prescribed by a health care provider.Birth control pills can also be used to treat heavy periods.  Minipill. This type of birth control pill contains only the progesterone hormone. They are taken every day of each month and must be prescribed by your health care provider.  Birth control patch. The patch contains hormones similar to those in birth control pills. It must be changed once a week and is prescribed by a health care provider.  Vaginal ring. The ring contains hormones similar to those in birth control pills. It is left in the vagina for 3 weeks, removed for 1 week, and then a new one is put back in place. The patient must be comfortable inserting and removing the ring  from the vagina.A health care provider's prescription is necessary.  Emergency contraception. Emergency contraceptives prevent pregnancy after unprotected sexual intercourse. This pill can be taken right after sex or up to 5 days after unprotected sex. It is most effective the sooner you take the pills after having sexual intercourse. Most emergency contraceptive pills are available without a prescription. Check with your pharmacist. Do not use emergency contraception as your only form of birth control. BARRIER METHODS   Female condom. This is a thin sheath (latex or rubber) that is worn over the penis during sexual intercourse. It can be used with spermicide to increase effectiveness.  Female condom. This is a soft, loose-fitting sheath that is put into the vagina before sexual intercourse.  Diaphragm. This is a soft, latex, dome-shaped barrier that must be fitted by a health care provider. It is inserted into the vagina, along with a spermicidal jelly. It is inserted before intercourse. The diaphragm should be left in the vagina for 6 to 8 hours after intercourse.  Cervical cap. This is a round, soft, latex or plastic cup that fits over the cervix and must be fitted by a health care provider. The cap can be left in place for up to 48 hours after intercourse.  Sponge. This is a soft, circular piece of polyurethane foam. The sponge has spermicide in it. It is inserted into the vagina after wetting it and before sexual intercourse.  Spermicides. These are chemicals that kill or block sperm from entering the cervix and uterus. They come in the form of creams, jellies, suppositories, foam, or tablets. They do not require a   prescription. They are inserted into the vagina with an applicator before having sexual intercourse. The process must be repeated every time you have sexual intercourse. INTRAUTERINE CONTRACEPTION  Intrauterine device (IUD). This is a T-shaped device that is put in a woman's uterus  during a menstrual period to prevent pregnancy. There are 2 types:  Copper IUD. This type of IUD is wrapped in copper wire and is placed inside the uterus. Copper makes the uterus and fallopian tubes produce a fluid that kills sperm. It can stay in place for 10 years.  Hormone IUD. This type of IUD contains the hormone progestin (synthetic progesterone). The hormone thickens the cervical mucus and prevents sperm from entering the uterus, and it also thins the uterine lining to prevent implantation of a fertilized egg. The hormone can weaken or kill the sperm that get into the uterus. It can stay in place for 3-5 years, depending on which type of IUD is used. PERMANENT METHODS OF CONTRACEPTION  Female tubal ligation. This is when the woman's fallopian tubes are surgically sealed, tied, or blocked to prevent the egg from traveling to the uterus.  Hysteroscopic sterilization. This involves placing a small coil or insert into each fallopian tube. Your doctor uses a technique called hysteroscopy to do the procedure. The device causes scar tissue to form. This results in permanent blockage of the fallopian tubes, so the sperm cannot fertilize the egg. It takes about 3 months after the procedure for the tubes to become blocked. You must use another form of birth control for these 3 months.  Female sterilization. This is when the female has the tubes that carry sperm tied off (vasectomy).This blocks sperm from entering the vagina during sexual intercourse. After the procedure, the man can still ejaculate fluid (semen). NATURAL PLANNING METHODS  Natural family planning. This is not having sexual intercourse or using a barrier method (condom, diaphragm, cervical cap) on days the woman could become pregnant.  Calendar method. This is keeping track of the length of each menstrual cycle and identifying when you are fertile.  Ovulation method. This is avoiding sexual intercourse during ovulation.  Symptothermal  method. This is avoiding sexual intercourse during ovulation, using a thermometer and ovulation symptoms.  Post-ovulation method. This is timing sexual intercourse after you have ovulated. Regardless of which type or method of contraception you choose, it is important that you use condoms to protect against the transmission of sexually transmitted infections (STIs). Talk with your health care provider about which form of contraception is most appropriate for you.   This information is not intended to replace advice given to you by your health care provider. Make sure you discuss any questions you have with your health care provider.   Document Released: 03/12/2005 Document Revised: 03/17/2013 Document Reviewed: 09/04/2012 Elsevier Interactive Patient Education 2016 Elsevier Inc.  

## 2016-01-26 NOTE — Progress Notes (Signed)
28 wk packet given  28 wk labs today; glucola due at 1110 Tdap vaccine declined

## 2016-01-27 LAB — HIV ANTIBODY (ROUTINE TESTING W REFLEX): HIV 1&2 Ab, 4th Generation: NONREACTIVE

## 2016-01-27 LAB — RPR

## 2016-02-01 NOTE — Progress Notes (Signed)
   PRENATAL VISIT NOTE  Subjective:  Leslie Simpson is a 25 y.o. G3P2002 at 5339w1d being seen today for ongoing prenatal care.  She is currently monitored for the following issues for this low-risk pregnancy and has LGSIL (low grade squamous intraepithelial lesion) on Pap smear; Supervision of normal pregnancy; Previous cesarean delivery, antepartum; Anemia of mother in pregnancy, antepartum; and Poor weight gain of pregnancy, second trimester on her problem list.  Patient reports no complaints.  Contractions: Not present. Vag. Bleeding: None.  Movement: Present. Denies leaking of fluid.   The following portions of the patient's history were reviewed and updated as appropriate: allergies, current medications, past family history, past medical history, past social history, past surgical history and problem list. Problem list updated.  Objective:   Vitals:   01/26/16 1005  BP: 97/64  Pulse: 82  Weight: 179 lb 14.4 oz (81.6 kg)    Fetal Status: Fetal Heart Rate (bpm): 140 Fundal Height: 28 cm Movement: Present  Presentation: Vertex  General:  Alert, oriented and cooperative. Patient is in no acute distress.  Skin: Skin is warm and dry. No rash noted.   Cardiovascular: Normal heart rate noted  Respiratory: Normal respiratory effort, no problems with respiration noted  Abdomen: Soft, gravid, appropriate for gestational age. Pain/Pressure: Present     Pelvic:  Cervical exam deferred        Extremities: Normal range of motion.  Edema: Trace  Mental Status: Normal mood and affect. Normal behavior. Normal judgment and thought content.   Assessment and Plan:  Pregnancy: G3P2002 at 5025w0d  1. Supervision of low-risk first pregnancy, second trimester  - Glucose Tolerance, 1 HR (50g) w/o Fasting - HIV antibody (with reflex) - CBC - RPR  2. Supervision of other normal pregnancy, antepartum   3. Poor weight gain of pregnancy, second trimester - Gaining weight now-up about 17 pounds from  lowest weight. Eating well - Watch fundal height closely and consider growth US.   Preterm labor symptoms and general obstetric precautions including but not limited to vaginal bleeding, contractions, leaking of fluid and fetal movement were reviewed in detail with the patient. Please refer to After Visit Summary for other counseling recommendations.  Return in about 2 weeks (around 02/09/2016) for ROB.   Dorathy KinsmanVirginia Germer, CNM

## 2016-02-13 ENCOUNTER — Encounter: Payer: Medicaid Other | Admitting: Obstetrics & Gynecology

## 2016-02-28 ENCOUNTER — Other Ambulatory Visit (HOSPITAL_COMMUNITY)
Admission: RE | Admit: 2016-02-28 | Discharge: 2016-02-28 | Disposition: A | Discharge status: Home / Self Care | Payer: Medicaid Other | Source: Ambulatory Visit | Attending: Obstetrics and Gynecology | Admitting: Obstetrics and Gynecology

## 2016-02-28 ENCOUNTER — Ambulatory Visit (INDEPENDENT_AMBULATORY_CARE_PROVIDER_SITE_OTHER): Payer: Medicaid Other | Admitting: Obstetrics and Gynecology

## 2016-02-28 VITALS — BP 106/60 | HR 70 | Wt 191.0 lb

## 2016-02-28 DIAGNOSIS — Z113 Encounter for screening for infections with a predominantly sexual mode of transmission: Secondary | ICD-10-CM | POA: Diagnosis not present

## 2016-02-28 DIAGNOSIS — O34219 Maternal care for unspecified type scar from previous cesarean delivery: Secondary | ICD-10-CM

## 2016-02-28 DIAGNOSIS — Z202 Contact with and (suspected) exposure to infections with a predominantly sexual mode of transmission: Secondary | ICD-10-CM

## 2016-02-28 DIAGNOSIS — Z3483 Encounter for supervision of other normal pregnancy, third trimester: Secondary | ICD-10-CM

## 2016-02-28 LAB — OB RESULTS CONSOLE GC/CHLAMYDIA: Gonorrhea: NEGATIVE

## 2016-02-28 NOTE — Progress Notes (Signed)
Pt requesting std testing today specifically gc/ch and wet prep.

## 2016-02-28 NOTE — Progress Notes (Signed)
   PRENATAL VISIT NOTE  Subjective:  Leslie Simpson is a 25 y.o. G3P2002 at 246w6d being seen today for ongoing prenatal care.  She is currently monitored for the following issues for this low-risk pregnancy and has LGSIL (low grade squamous intraepithelial lesion) on Pap smear; Supervision of normal pregnancy; Previous cesarean delivery, antepartum; Anemia of mother in pregnancy, antepartum; and Poor weight gain of pregnancy, second trimester on her problem list.  Patient reports no complaints.  Contractions: Not present. Vag. Bleeding: None.  Movement: Present. Denies leaking of fluid.   The following portions of the patient's history were reviewed and updated as appropriate: allergies, current medications, past family history, past medical history, past social history, past surgical history and problem list. Problem list updated.  Objective:   Vitals:   02/28/16 1319  BP: 106/60  Pulse: 70  Weight: 86.6 kg (191 lb)    Fetal Status: Fetal Heart Rate (bpm): 130   Movement: Present     General:  Alert, oriented and cooperative. Patient is in no acute distress.  Skin: Skin is warm and dry. No rash noted.   Cardiovascular: Normal heart rate noted  Respiratory: Normal respiratory effort, no problems with respiration noted  Abdomen: Soft, gravid, appropriate for gestational age. Pain/Pressure: Present     Pelvic:  Cervical exam deferred        Extremities: Normal range of motion.  Edema: Trace  Mental Status: Normal mood and affect. Normal behavior. Normal judgment and thought content.   Assessment and Plan:  Pregnancy: G3P2002 at 4246w6d  1. Possible exposure to STD Cultures collected today as patient suspects an exposure to STD - Wet prep, genital - [facility]lam[facility]  2. Encounter for supervision of other normal pregnancy in third trimester Patient is doing well without complaints  3. Previous cesarean delivery, antepartum [facility]  Preterm labor symptoms and general obstetric precautions including but not limited to vaginal bleeding, contractions, leaking of fluid and fetal movement were reviewed in detail with the patient. Please refer to After Visit Summary for other counseling recommendations.  No Follow-up on file.   Catalina AntiguaPeggy Cagney Steenson, MD

## 2016-02-29 LAB — WET PREP, GENITAL
Trich, Wet Prep: NONE SEEN
YEAST WET PREP: NONE SEEN

## 2016-02-29 LAB — GC/CHLAMYDIA PROBE AMP (~~LOC~~) NOT AT ARMC
Chlamydia: NEGATIVE
NEISSERIA GONORRHEA: NEGATIVE

## 2016-03-01 ENCOUNTER — Telehealth: Payer: Self-pay

## 2016-03-01 ENCOUNTER — Other Ambulatory Visit: Payer: Self-pay | Admitting: Obstetrics and Gynecology

## 2016-03-01 MED ORDER — METRONIDAZOLE 500 MG PO TABS: 500.0000 mg | ORAL_TABLET | Freq: Two times a day (BID) | ORAL | 0 refills | Status: DC

## 2016-03-01 NOTE — Telephone Encounter (Signed)
Patient inform of lab results and need to start medication that has been called into the pharmacy.

## 2016-03-01 NOTE — Telephone Encounter (Signed)
-----   Message from Catalina AntiguaPeggy Constant, MD sent at 03/01/2016 10:27 AM EST ----- Please inform patient of BV infection. Rx e-prescribed  Thanks  [employer] Morgan Energy[employer]eggy

## 2016-03-14 ENCOUNTER — Encounter: Payer: Medicaid Other | Admitting: Student

## 2016-03-26 NOTE — L&D Delivery Note (Signed)
26 y.o. G3P3003 at 485w0d delivered a viable female infant in cephalic, LOA position. x1 nuchal cord, easily reduced. Anterior shoulder delivered with ease. 60 sec delayed cord clamping. Cord clamped x2 and cut. Placenta delivered spontaneously intact, with 3VC. Fundus firm on exam with massage and pitocin. Good hemostasis noted.  Laceration: none Suture: none  Good hemostasis noted. EBL 150cc  Mom and baby recovering in LDR.    Apgars: 6/9 Weight: pending    Renne Muscaaniel L Warden, MD PGY-1 04/26/2016, 5:09 PM   OB FELLOW DISCHARGE ATTESTATION  I have seen and examined this patient and agree with above documentation in the resident's note.   Ernestina PennaNicholas Schenk, MD 5:33 PM

## 2016-03-28 ENCOUNTER — Other Ambulatory Visit (HOSPITAL_COMMUNITY)
Admission: RE | Admit: 2016-03-28 | Discharge: 2016-03-28 | Disposition: A | Discharge status: Home / Self Care | Payer: Medicaid Other | Source: Ambulatory Visit | Attending: Advanced Practice Midwife | Admitting: Advanced Practice Midwife

## 2016-03-28 ENCOUNTER — Ambulatory Visit (INDEPENDENT_AMBULATORY_CARE_PROVIDER_SITE_OTHER): Payer: Medicaid Other | Admitting: Advanced Practice Midwife

## 2016-03-28 VITALS — BP 108/56 | HR 73 | Wt 198.8 lb

## 2016-03-28 DIAGNOSIS — Z113 Encounter for screening for infections with a predominantly sexual mode of transmission: Secondary | ICD-10-CM | POA: Insufficient documentation

## 2016-03-28 DIAGNOSIS — Z3483 Encounter for supervision of other normal pregnancy, third trimester: Secondary | ICD-10-CM

## 2016-03-28 DIAGNOSIS — O34219 Maternal care for unspecified type scar from previous cesarean delivery: Secondary | ICD-10-CM

## 2016-03-28 LAB — OB RESULTS CONSOLE GBS: STREP GROUP B AG: NEGATIVE

## 2016-03-28 NOTE — Patient Instructions (Signed)
Braxton Hicks Contractions °Contractions of the uterus can occur throughout pregnancy. Contractions are not always a sign that you are in labor.  °WHAT ARE BRAXTON HICKS CONTRACTIONS?  °Contractions that occur before labor are called Braxton Hicks contractions, or false labor. Toward the end of pregnancy (32-34 weeks), these contractions can develop more often and may become more forceful. This is not true labor because these contractions do not result in opening (dilatation) and thinning of the cervix. They are sometimes difficult to tell apart from true labor because these contractions can be forceful and people have different pain tolerances. You should not feel embarrassed if you go to the hospital with false labor. Sometimes, the only way to tell if you are in true labor is for your health care provider to look for changes in the cervix. °If there are no prenatal problems or other health problems associated with the pregnancy, it is completely safe to be sent home with false labor and await the onset of true labor. °HOW CAN YOU TELL THE DIFFERENCE BETWEEN TRUE AND FALSE LABOR? °False Labor  °· The contractions of false labor are usually shorter and not as hard as those of true labor.   °· The contractions are usually irregular.   °· The contractions are often felt in the front of the lower abdomen and in the groin.   °· The contractions may go away when you walk around or change positions while lying down.   °· The contractions get weaker and are shorter lasting as time goes on.   °· The contractions do not usually become progressively stronger, regular, and closer together as with true labor.   °True Labor  °· Contractions in true labor last 30-70 seconds, become very regular, usually become more intense, and increase in frequency.   °· The contractions do not go away with walking.   °· The discomfort is usually felt in the top of the uterus and spreads to the lower abdomen and low back.   °· True labor can be  determined by your health care provider with an exam. This will show that the cervix is dilating and getting thinner.   °WHAT TO REMEMBER °· Keep up with your usual exercises and follow other instructions given by your health care provider.   °· Take medicines as directed by your health care provider.   °· Keep your regular prenatal appointments.   °· Eat and drink lightly if you think you are going into labor.   °· If Braxton Hicks contractions are making you uncomfortable:   °¨ Change your position from lying down or resting to walking, or from walking to resting.   °¨ Sit and rest in a tub of warm water.   °¨ Drink 2-3 glasses of water. Dehydration may cause these contractions.   °¨ Do slow and deep breathing several times an hour.   °WHEN SHOULD I SEEK IMMEDIATE MEDICAL CARE? °Seek immediate medical care if: °· Your contractions become stronger, more regular, and closer together.   °· You have fluid leaking or gushing from your vagina.   °· You have a fever.   °· You pass blood-tinged mucus.   °· You have vaginal bleeding.   °· You have continuous abdominal pain.   °· You have low back pain that you never had before.   °· You feel your baby's head pushing down and causing pelvic pressure.   °· Your baby is not moving as much as it used to.   °This information is not intended to replace advice given to you by your health care provider. Make sure you discuss any questions you have with your health care   provider. °Document Released: 03/12/2005 Document Revised: 07/04/2015 Document Reviewed: 12/22/2012 °Elsevier Interactive Patient Education © 2017 Elsevier Inc. ° °

## 2016-03-28 NOTE — Addendum Note (Signed)
Addended by: Gerome ApleyZEYFANG, LINDA L on: 03/28/2016 04:13 PM   Modules accepted: Orders

## 2016-03-28 NOTE — Progress Notes (Signed)
   PRENATAL VISIT NOTE  Subjective:  Leslie Simpson is a 26 y.o. G3P2002 at 3026w6d being seen today for ongoing prenatal care.  She is currently monitored for the following issues for this low-risk pregnancy and has LGSIL (low grade squamous intraepithelial lesion) on Pap smear; Supervision of normal pregnancy; Previous cesarean delivery, antepartum; Anemia of mother in pregnancy, antepartum; and Poor weight gain of pregnancy, second trimester on her problem list.  Patient reports occasional contractions.  Contractions: Irregular. Vag. Bleeding: None.  Movement: Present. Denies leaking of fluid.   Due date adjusted. Wrong LMP in Epic.   The following portions of the patient's history were reviewed and updated as appropriate: allergies, current medications, past family history, past medical history, past social history, past surgical history and problem list. Problem list updated.  Objective:   Vitals:   03/28/16 1145  BP: (!) 108/56  Pulse: 73  Weight: 198 lb 12.8 oz (90.2 kg)    Fetal Status: Fetal Heart Rate (bpm): 146 Fundal Height: 37 cm Movement: Present  Presentation: Vertex  General:  Alert, oriented and cooperative. Patient is in no acute distress.  Skin: Skin is warm and dry. No rash noted.   Cardiovascular: Normal heart rate noted  Respiratory: Normal respiratory effort, no problems with respiration noted  Abdomen: Soft, gravid, appropriate for gestational age. Pain/Pressure: Present     Pelvic:  Cervical exam performed Dilation: FT Effacement (%): 0 Station: Ballotable  Extremities: Normal range of motion.  Edema: Trace  Mental Status: Normal mood and affect. Normal behavior. Normal judgment and thought content.   Assessment and Plan:  Pregnancy: G3P2002 at 4426w6d  1. Encounter for supervision of other normal pregnancy in third trimester  - Culture, beta strep (group b only)  2. Previous cesarean delivery, antepartum   Term labor symptoms and general obstetric  precautions including but not limited to vaginal bleeding, contractions, leaking of fluid and fetal movement were reviewed in detail with the patient. Please refer to After Visit Summary for other counseling recommendations.  Return in about 1 week (around 04/04/2016) for ROB.   Dorathy KinsmanVirginia Muscatello, CNM

## 2016-03-29 LAB — GC/CHLAMYDIA PROBE AMP (~~LOC~~) NOT AT ARMC
Chlamydia: NEGATIVE
Neisseria Gonorrhea: NEGATIVE

## 2016-03-30 LAB — CULTURE, BETA STREP (GROUP B ONLY)

## 2016-04-04 ENCOUNTER — Encounter: Payer: Medicaid Other | Admitting: Student

## 2016-04-04 ENCOUNTER — Encounter: Payer: Medicaid Other | Admitting: Advanced Practice Midwife

## 2016-04-05 ENCOUNTER — Encounter: Payer: Self-pay | Admitting: Advanced Practice Midwife

## 2016-04-11 ENCOUNTER — Encounter: Payer: Medicaid Other | Admitting: Family Medicine

## 2016-04-16 ENCOUNTER — Ambulatory Visit (INDEPENDENT_AMBULATORY_CARE_PROVIDER_SITE_OTHER): Payer: Medicaid Other | Admitting: Obstetrics & Gynecology

## 2016-04-16 VITALS — BP 110/59 | HR 71 | Wt 201.0 lb

## 2016-04-16 DIAGNOSIS — O99013 Anemia complicating pregnancy, third trimester: Secondary | ICD-10-CM

## 2016-04-16 DIAGNOSIS — D649 Anemia, unspecified: Secondary | ICD-10-CM

## 2016-04-16 DIAGNOSIS — Z3483 Encounter for supervision of other normal pregnancy, third trimester: Secondary | ICD-10-CM

## 2016-04-16 LAB — CBC
HCT: 32.3 % — ABNORMAL LOW (ref 35.0–45.0)
HEMOGLOBIN: 10 g/dL — AB (ref 11.7–15.5)
MCH: 23.9 pg — ABNORMAL LOW (ref 27.0–33.0)
MCHC: 31 g/dL — ABNORMAL LOW (ref 32.0–36.0)
MCV: 77.3 fL — ABNORMAL LOW (ref 80.0–100.0)
Platelets: 216 10*3/uL (ref 140–400)
RBC: 4.18 MIL/uL (ref 3.80–5.10)
RDW: 17.5 % — ABNORMAL HIGH (ref 11.0–15.0)
WBC: 9 10*3/uL (ref 3.8–10.8)

## 2016-04-16 NOTE — Progress Notes (Signed)
   PRENATAL VISIT NOTE  Subjective:  Leslie Simpson is a [age] y.o. G3P2002 at 8378w4d being seen today for ongoing prenatal care.  She is currently monitored for the following issues for this high-risk pregnancy and has LGSIL (low grade squamous intraepithelial lesion) on Pap smear; Supervision of normal pregnancy; Previous cesarean delivery, antepartum; Anemia of mother in pregnancy, antepartum; and Poor weight gain of pregnancy, second trimester on her problem list.  Patient reports no complaints.  Contractions: Irregular. Vag. Bleeding: None.  Movement: Present. Denies leaking of fluid.   The following portions of the patient's history were reviewed and updated as appropriate: allergies, current medications, past family history, past medical history, past social history, past surgical history and problem list. Problem list updated.  Objective:   Vitals:   04/16/16 0830  BP: (!) 110/59  Pulse: 71  Weight: 201 lb (91.2 kg)    Fetal Status: Fetal Heart Rate (bpm): 148   Movement: Present     General:  Alert, oriented and cooperative. Patient is in no acute distress.  Skin: Skin is warm and dry. No rash noted.   Cardiovascular: Normal heart rate noted  Respiratory: Normal respiratory effort, no problems with respiration noted  Abdomen: Soft, gravid, appropriate for gestational age. Pain/Pressure: Present     Pelvic:  Cervical exam performed      2/50/bal  Extremities: Normal range of motion.  Edema: Trace  Mental Status: Normal mood and affect. Normal behavior. Normal judgment and thought content.   Assessment and Plan:  Pregnancy: G3P2002 at 7678w4d  1. Anemia during pregnancy in third trimester - CBC  2. Encounter for supervision of other normal pregnancy in third trimester -Pt desires VBAC  Term labor symptoms and general obstetric precautions including but not limited to vaginal bleeding, contractions, leaking of fluid and fetal movement were reviewed in detail with the  patient. Please refer to After Visit Summary for other counseling recommendations.  No Follow-up on file.   Lesly DukesKelly H Axten Pascucci, MD

## 2016-04-23 ENCOUNTER — Ambulatory Visit (INDEPENDENT_AMBULATORY_CARE_PROVIDER_SITE_OTHER): Payer: Medicaid Other | Admitting: Obstetrics and Gynecology

## 2016-04-23 VITALS — BP 128/83 | HR 73 | Wt 200.4 lb

## 2016-04-23 DIAGNOSIS — Z3403 Encounter for supervision of normal first pregnancy, third trimester: Secondary | ICD-10-CM | POA: Diagnosis present

## 2016-04-23 DIAGNOSIS — O480 Post-term pregnancy: Secondary | ICD-10-CM | POA: Diagnosis not present

## 2016-04-23 DIAGNOSIS — D649 Anemia, unspecified: Secondary | ICD-10-CM

## 2016-04-23 DIAGNOSIS — O99013 Anemia complicating pregnancy, third trimester: Secondary | ICD-10-CM

## 2016-04-23 DIAGNOSIS — O99019 Anemia complicating pregnancy, unspecified trimester: Secondary | ICD-10-CM

## 2016-04-23 DIAGNOSIS — O34219 Maternal care for unspecified type scar from previous cesarean delivery: Secondary | ICD-10-CM

## 2016-04-23 NOTE — Progress Notes (Signed)
Called L&D and Induction slots full; scheduled for direct admit at 9am.

## 2016-04-23 NOTE — Progress Notes (Signed)
   PRENATAL VISIT NOTE  Subjective:  Leslie Simpson is a 26 y.o. G3P2002 at 6371w4d being seen today for ongoing prenatal care.  She is currently monitored for the following issues for this low-risk pregnancy and has LGSIL (low grade squamous intraepithelial lesion) on Pap smear; Supervision of normal pregnancy; Previous cesarean delivery, antepartum; Anemia of mother in pregnancy, antepartum; and Poor weight gain of pregnancy, second trimester on her problem list.  Patient reports no complaints.  Contractions: Irregular. Vag. Bleeding: None.  Movement: Present. Denies leaking of fluid.   The following portions of the patient's history were reviewed and updated as appropriate: allergies, current medications, past family history, past medical history, past social history, past surgical history and problem list. Problem list updated.  Objective:   Vitals:   04/23/16 0958  BP: 128/83  Pulse: 73  Weight: 200 lb 6.4 oz (90.9 kg)    Fetal Status: Fetal Heart Rate (bpm): 148 Fundal Height: 41 cm Movement: Present  Presentation: Vertex  General:  Alert, oriented and cooperative. Patient is in no acute distress.  Skin: Skin is warm and dry. No rash noted.   Cardiovascular: Normal heart rate noted  Respiratory: Normal respiratory effort, no problems with respiration noted  Abdomen: Soft, gravid, appropriate for gestational age. Pain/Pressure: Present     Pelvic:  Cervical exam performed Dilation: 2 Effacement (%): 50 Station: -3  Extremities: Normal range of motion.  Edema: Trace  Mental Status: Normal mood and affect. Normal behavior. Normal judgment and thought content.   Assessment and Plan:  Pregnancy: G3P2002 at 5271w4d  1. Encounter for supervision of normal first pregnancy in third trimester - GBS negative  - NST reactive today:   2. Previous cesarean delivery, antepartum - TOLAC; consent signed on 9/7 by the patient  - Patient scheduled for induction of labor on 2/1  3. Anemia of  mother in pregnancy, antepartum - Continue iron supplements   There are no diagnoses linked to this encounter. Term labor symptoms and general obstetric precautions including but not limited to vaginal bleeding, contractions, leaking of fluid and fetal movement were reviewed in detail with the patient. Please refer to After Visit Summary for other counseling recommendations.  Return in about 3 days (around 04/26/2016) for For induction of labor .   Duane LopeJennifer I Rasch, NP

## 2016-04-24 NOTE — Progress Notes (Signed)
Pt was scheduled as a direct admit for 04/26/16 at 0900. Spoke with Dr. Omer JackMumaw regarding moving her into the open 0630 IOL spot. Md agreered. Called and spoke with patient and advised of her new time for IOL. Pt verbalized understanding. Reviewed labor precautions with patient.

## 2016-04-25 ENCOUNTER — Inpatient Hospital Stay (HOSPITAL_COMMUNITY)
Admission: AD | Admit: 2016-04-25 | Discharge: 2016-04-25 | Disposition: A | Discharge status: Home / Self Care | Payer: Medicaid Other | Source: Ambulatory Visit | Attending: Obstetrics & Gynecology | Admitting: Obstetrics & Gynecology

## 2016-04-25 ENCOUNTER — Encounter (HOSPITAL_COMMUNITY): Payer: Self-pay

## 2016-04-25 ENCOUNTER — Other Ambulatory Visit: Payer: Self-pay | Admitting: Obstetrics and Gynecology

## 2016-04-25 ENCOUNTER — Other Ambulatory Visit: Payer: Self-pay | Admitting: Advanced Practice Midwife

## 2016-04-25 DIAGNOSIS — Z3A40 40 weeks gestation of pregnancy: Secondary | ICD-10-CM | POA: Insufficient documentation

## 2016-04-25 DIAGNOSIS — N898 Other specified noninflammatory disorders of vagina: Secondary | ICD-10-CM

## 2016-04-25 DIAGNOSIS — O26893 Other specified pregnancy related conditions, third trimester: Secondary | ICD-10-CM

## 2016-04-25 DIAGNOSIS — O471 False labor at or after 37 completed weeks of gestation: Secondary | ICD-10-CM | POA: Insufficient documentation

## 2016-04-25 DIAGNOSIS — O479 False labor, unspecified: Secondary | ICD-10-CM

## 2016-04-25 NOTE — MAU Provider Note (Signed)
History   [id number]   Chief Complaint  Patient presents with  . Contractions  . Rupture of Membranes    HPI Leslie Simpson is a 26 y.o. female  G3P2002 here with report of watery vaginal discharge that began yesterday. Describes as thin, peach colored discharge. Leaking of fluid has continued.  Pt reports contractions every 5 minutes and denies vaginal bleeding. Positive fetal movement.   All other systems negative.  Scheduled for induction tomorrow morning.   Patient's last menstrual period was 07/14/2015 (exact date).  OB History  Gravida Para Term Preterm AB Living  3 2 2     2   SAB TAB Ectopic Multiple Live Births               # Outcome Date GA Lbr Len/2nd Weight Sex Delivery Anes PTL Lv  3 Current           2 Term 08/31/09 4151w0d  7 lb (3.175 kg) F Vag-Spont None       [facility] Comments: [facility] complications[facility]  1 Term 11/03/07 7751w0d  10 lb 4 oz (4.649 kg) F CS-Unspec EPI       [facility]      Past Medical History:  Diagnosis Date  . Abnormal pap 2012    Family History  Problem Relation Age of Onset  . Asthma Mother   . Hypertension Father   . Diabetes Father     Social History   Social History  . Marital status: Single    Spouse name: N/A  . Number of children: N/A  . Years of education: N/A   Social History Main Topics  . Smoking status: Never Smoker  . Smokeless tobacco: Never Used  . Alcohol use Yes     Comment: social drinking  . Drug use: No  . Sexual activity: Yes    Partners: Male    Birth control/ protection: Condom   Other Topics Concern  . None   Social History Narrative  . None    Allergies  Allergen Reactions  . Oysters [Shellfish Allergy] Hives  . Raspberry Hives and Swelling    No current facility-administered medications on file prior to encounter.    Current Outpatient Prescriptions on File Prior to Encounter  Medication Sig Dispense Refill  . ferrous sulfate  (FERROUSUL) 325 (65 FE) MG tablet Take 1 tablet (325 mg total) by mouth 2 (two) times daily. 60 tablet 3     Review of Systems  Constitutional: Negative.   Gastrointestinal: Positive for abdominal pain.  Genitourinary: Positive for vaginal discharge. Negative for vaginal bleeding.   Physical Exam   Vitals:   04/25/16 1628  BP: 121/73  Pulse: 74  Resp: 16  Temp: 98.3 F (36.8 C)  TempSrc: Oral  Weight: 202 lb (91.6 kg)  Height: 5\' 1"  (1.549 m)    Physical Exam  Nursing note and vitals reviewed. Constitutional: She is oriented to person, place, and time. She appears well-developed and well-nourished. No distress.  HENT:  Head: Normocephalic and atraumatic.  Eyes: Conjunctivae are normal. Right eye exhibits no discharge. Left eye exhibits no discharge. No scleral icterus.  Neck: Normal range of motion.  Respiratory: Effort normal. No respiratory distress.  Genitourinary:  Genitourinary Comments: Small amount of thin tan discharge. No pooling.   Neurological: She is alert and oriented to person, place, and time.  Skin: Skin is warm and dry. She is not diaphoretic.  Psychiatric: She  has a normal mood and affect. Her behavior is normal. Judgment and thought content normal.   Dilation: 3 Effacement (%): 80 Cervical Position: Posterior Station: -3 Presentation: Vertex Exam by:: Kayren Eaves RN   Fetal Tracing:  Baseline: 130 Variability: moderate Accelerations: 15x15 Decelerations: none   Toco: 4-10 min, 60-120 sec MAU Course  Procedures No results found for this or any previous visit (from the past 24 hour(s)).  MDM Reactive fetal tracing Minimal change from previous SVE; no SVE change during MAU stay No pooling, fern negative Assessment and Plan  A: 1. Labor, false (Braxton-Hicks), antepartum   2. Vaginal discharge during pregnancy in third trimester    P: Discharge home Labor precautions Return in the morning for scheduled IOL   Judeth Horn,  NP 04/25/2016 5:03 PM

## 2016-04-25 NOTE — Discharge Instructions (Signed)
Labor Induction Labor induction is when steps are taken to cause a pregnant woman to begin the labor process. Most women go into labor on their own between 37 weeks and 42 weeks of the pregnancy. When this does not happen or when there is a medical need, methods may be used to induce labor. Labor induction causes a pregnant woman's uterus to contract. It also causes the cervix to soften (ripen), open (dilate), and thin out (efface). Usually, labor is not induced before 39 weeks of the pregnancy unless there is a problem with the baby or mother. Before inducing labor, your health care provider will consider a number of factors, including the following:  The medical condition of you and the baby.  How many weeks along you are.  The status of the baby's lung maturity.  The condition of the cervix.  The position of the baby. What are the reasons for labor induction? Labor may be induced for the following reasons:  The health of the baby or mother is at risk.  The pregnancy is overdue by 1 week or more.  The water breaks but labor does not start on its own.  The mother has a health condition or serious illness, such as high blood pressure, infection, placental abruption, or diabetes.  The amniotic fluid amounts are low around the baby.  The baby is distressed. Convenience or wanting the baby to be born on a certain date is not a reason for inducing labor. What methods are used for labor induction? Several methods of labor induction may be used, such as:  Prostaglandin medicine. This medicine causes the cervix to dilate and ripen. The medicine will also start contractions. It can be taken by mouth or by inserting a suppository into the vagina.  Inserting a thin tube (catheter) with a balloon on the end into the vagina to dilate the cervix. Once inserted, the balloon is expanded with water, which causes the cervix to open.  Stripping the membranes. Your health care provider separates  amniotic sac tissue from the cervix, causing the cervix to be stretched and causing the release of a hormone called progesterone. This may cause the uterus to contract. It is often done during an office visit. You will be sent home to wait for the contractions to begin. You will then come in for an induction.  Breaking the water. Your health care provider makes a hole in the amniotic sac using a small instrument. Once the amniotic sac breaks, contractions should begin. This may still take hours to see an effect.  Medicine to trigger or strengthen contractions. This medicine is given through an IV access tube inserted into a vein in your arm. All of the methods of induction, besides stripping the membranes, will be done in the hospital. Induction is done in the hospital so that you and the baby can be carefully monitored. How long does it take for labor to be induced? Some inductions can take up to 2-3 days. Depending on the cervix, it usually takes less time. It takes longer when you are induced early in the pregnancy or if this is your first pregnancy. If a mother is still pregnant and the induction has been going on for 2-3 days, either the mother will be sent home or a cesarean delivery will be needed. What are the risks associated with labor induction? Some of the risks of induction include:  Changes in fetal heart rate, such as too high, too low, or erratic.  Fetal distress.    Chance of infection for the mother and baby.  Increased chance of having a cesarean delivery.  Breaking off (abruption) of the placenta from the uterus (rare).  Uterine rupture (very rare). When induction is needed for medical reasons, the benefits of induction may outweigh the risks. What are some reasons for not inducing labor? Labor induction should not be done if:  It is shown that your baby does not tolerate labor.  You have had previous surgeries on your uterus, such as a myomectomy or the removal of  fibroids.  Your placenta lies very low in the uterus and blocks the opening of the cervix (placenta previa).  Your baby is not in a head-down position.  The umbilical cord drops down into the birth canal in front of the baby. This could cut off the baby's blood and oxygen supply.  You have had a previous cesarean delivery.  There are unusual circumstances, such as the baby being extremely premature. This information is not intended to replace advice given to you by your health care provider. Make sure you discuss any questions you have with your health care provider. Document Released: 08/01/2006 Document Revised: 08/18/2015 Document Reviewed: 10/09/2012 Elsevier Interactive Patient Education  2017 [employer]  

## 2016-04-25 NOTE — MAU Note (Signed)
Back has been hurting since yesterday, small amt of bleeding, contractions every 3-5 min since this morning. Little water- first noted yesterday morning, and mucous.

## 2016-04-26 ENCOUNTER — Inpatient Hospital Stay (HOSPITAL_COMMUNITY): Payer: Medicaid Other | Admitting: Anesthesiology

## 2016-04-26 ENCOUNTER — Inpatient Hospital Stay (HOSPITAL_COMMUNITY)
Admission: RE | Admit: 2016-04-26 | Discharge: 2016-04-27 | DRG: 775 | Disposition: A | Discharge status: Home / Self Care | Payer: Medicaid Other | Source: Ambulatory Visit | Attending: Obstetrics and Gynecology | Admitting: Obstetrics and Gynecology

## 2016-04-26 ENCOUNTER — Encounter (HOSPITAL_COMMUNITY): Payer: Self-pay

## 2016-04-26 DIAGNOSIS — O9081 Anemia of the puerperium: Secondary | ICD-10-CM | POA: Diagnosis not present

## 2016-04-26 DIAGNOSIS — O480 Post-term pregnancy: Secondary | ICD-10-CM | POA: Diagnosis present

## 2016-04-26 DIAGNOSIS — O34219 Maternal care for unspecified type scar from previous cesarean delivery: Secondary | ICD-10-CM

## 2016-04-26 DIAGNOSIS — O34211 Maternal care for low transverse scar from previous cesarean delivery: Secondary | ICD-10-CM | POA: Diagnosis present

## 2016-04-26 DIAGNOSIS — Z3403 Encounter for supervision of normal first pregnancy, third trimester: Secondary | ICD-10-CM

## 2016-04-26 DIAGNOSIS — Z3A41 41 weeks gestation of pregnancy: Secondary | ICD-10-CM | POA: Diagnosis not present

## 2016-04-26 DIAGNOSIS — O2612 Low weight gain in pregnancy, second trimester: Secondary | ICD-10-CM

## 2016-04-26 DIAGNOSIS — O99019 Anemia complicating pregnancy, unspecified trimester: Secondary | ICD-10-CM

## 2016-04-26 DIAGNOSIS — Z8249 Family history of ischemic heart disease and other diseases of the circulatory system: Secondary | ICD-10-CM | POA: Diagnosis not present

## 2016-04-26 DIAGNOSIS — D649 Anemia, unspecified: Secondary | ICD-10-CM | POA: Diagnosis not present

## 2016-04-26 DIAGNOSIS — Z833 Family history of diabetes mellitus: Secondary | ICD-10-CM | POA: Diagnosis not present

## 2016-04-26 LAB — ABO/RH: ABO/RH(D): O POS

## 2016-04-26 LAB — CBC
HEMATOCRIT: 32.5 % — AB (ref 36.0–46.0)
HEMOGLOBIN: 10.8 g/dL — AB (ref 12.0–15.0)
MCH: 24.9 pg — ABNORMAL LOW (ref 26.0–34.0)
MCHC: 33.2 g/dL (ref 30.0–36.0)
MCV: 74.9 fL — AB (ref 78.0–100.0)
Platelets: 171 10*3/uL (ref 150–400)
RBC: 4.34 MIL/uL (ref 3.87–5.11)
RDW: 16.6 % — AB (ref 11.5–15.5)
WBC: 9 10*3/uL (ref 4.0–10.5)

## 2016-04-26 LAB — RPR: RPR Ser Ql: NONREACTIVE

## 2016-04-26 LAB — TYPE AND SCREEN
ABO/RH(D): O POS
ANTIBODY SCREEN: NEGATIVE

## 2016-04-26 MED ORDER — PHENYLEPHRINE 40 MCG/ML (10ML) SYRINGE FOR IV PUSH (FOR BLOOD PRESSURE SUPPORT): 80.0000 ug | PREFILLED_SYRINGE | INTRAVENOUS | Status: DC | PRN

## 2016-04-26 MED ORDER — EPHEDRINE 5 MG/ML INJ
10.0000 mg | INTRAVENOUS | Status: DC | PRN
  Filled 2016-04-26: qty 4

## 2016-04-26 MED ORDER — LIDOCAINE HCL (PF) 1 % IJ SOLN
30.0000 mL | INTRAMUSCULAR | Status: DC | PRN
  Filled 2016-04-26: qty 30

## 2016-04-26 MED ORDER — DIPHENHYDRAMINE HCL 50 MG/ML IJ SOLN: 12.5000 mg | INTRAMUSCULAR | Status: DC | PRN

## 2016-04-26 MED ORDER — BENZOCAINE-MENTHOL 20-0.5 % EX AERO: 1.0000 "application " | INHALATION_SPRAY | CUTANEOUS | Status: DC | PRN

## 2016-04-26 MED ORDER — OXYTOCIN BOLUS FROM INFUSION
500.0000 mL | Freq: Once | INTRAVENOUS | Status: AC
  Administered 2016-04-26: 500 mL via INTRAVENOUS

## 2016-04-26 MED ORDER — SOD CITRATE-CITRIC ACID 500-334 MG/5ML PO SOLN: 30.0000 mL | ORAL | Status: DC | PRN

## 2016-04-26 MED ORDER — OXYTOCIN 40 UNITS IN LACTATED RINGERS INFUSION - SIMPLE MED
2.5000 [IU]/h | INTRAVENOUS | Status: DC
  Administered 2016-04-26: 2.5 [IU]/h via INTRAVENOUS

## 2016-04-26 MED ORDER — TERBUTALINE SULFATE 1 MG/ML IJ SOLN
0.2500 mg | Freq: Once | INTRAMUSCULAR | Status: DC | PRN
  Filled 2016-04-26: qty 1

## 2016-04-26 MED ORDER — SIMETHICONE 80 MG PO CHEW: 80.0000 mg | CHEWABLE_TABLET | ORAL | Status: DC | PRN

## 2016-04-26 MED ORDER — COCONUT OIL OIL: 1.0000 "application " | TOPICAL_OIL | Status: DC | PRN

## 2016-04-26 MED ORDER — SENNOSIDES-DOCUSATE SODIUM 8.6-50 MG PO TABS
2.0000 | ORAL_TABLET | ORAL | Status: DC
  Administered 2016-04-26: 2 via ORAL
  Filled 2016-04-26: qty 2

## 2016-04-26 MED ORDER — TETANUS-DIPHTH-ACELL PERTUSSIS 5-2.5-18.5 LF-MCG/0.5 IM SUSP: 0.5000 mL | Freq: Once | INTRAMUSCULAR | Status: DC

## 2016-04-26 MED ORDER — FENTANYL CITRATE (PF) 100 MCG/2ML IJ SOLN
100.0000 ug | INTRAMUSCULAR | Status: DC | PRN
  Administered 2016-04-26: 100 ug via INTRAVENOUS
  Filled 2016-04-26: qty 2

## 2016-04-26 MED ORDER — ONDANSETRON HCL 4 MG PO TABS: 4.0000 mg | ORAL_TABLET | ORAL | Status: DC | PRN

## 2016-04-26 MED ORDER — FENTANYL 2.5 MCG/ML BUPIVACAINE 1/10 % EPIDURAL INFUSION (WH - ANES)
14.0000 mL/h | INTRAMUSCULAR | Status: DC | PRN
  Administered 2016-04-26: 14 mL/h via EPIDURAL
  Filled 2016-04-26: qty 100

## 2016-04-26 MED ORDER — DIPHENHYDRAMINE HCL 25 MG PO CAPS: 25.0000 mg | ORAL_CAPSULE | Freq: Four times a day (QID) | ORAL | Status: DC | PRN

## 2016-04-26 MED ORDER — EPHEDRINE 5 MG/ML INJ: 10.0000 mg | INTRAVENOUS | Status: DC | PRN

## 2016-04-26 MED ORDER — ONDANSETRON HCL 4 MG/2ML IJ SOLN: 4.0000 mg | Freq: Four times a day (QID) | INTRAMUSCULAR | Status: DC | PRN

## 2016-04-26 MED ORDER — ACETAMINOPHEN 325 MG PO TABS: 650.0000 mg | ORAL_TABLET | ORAL | Status: DC | PRN

## 2016-04-26 MED ORDER — LACTATED RINGERS IV SOLN
INTRAVENOUS | Status: DC
  Administered 2016-04-26: 08:00:00 via INTRAVENOUS

## 2016-04-26 MED ORDER — LIDOCAINE HCL (PF) 1 % IJ SOLN
INTRAMUSCULAR | Status: DC | PRN
  Administered 2016-04-26: 5 mL via EPIDURAL

## 2016-04-26 MED ORDER — LACTATED RINGERS IV SOLN: 500.0000 mL | INTRAVENOUS | Status: DC | PRN

## 2016-04-26 MED ORDER — ONDANSETRON HCL 4 MG/2ML IJ SOLN: 4.0000 mg | INTRAMUSCULAR | Status: DC | PRN

## 2016-04-26 MED ORDER — WITCH HAZEL-GLYCERIN EX PADS: 1.0000 "application " | MEDICATED_PAD | CUTANEOUS | Status: DC | PRN

## 2016-04-26 MED ORDER — OXYTOCIN 40 UNITS IN LACTATED RINGERS INFUSION - SIMPLE MED
1.0000 m[IU]/min | INTRAVENOUS | Status: DC
  Administered 2016-04-26: 2 m[IU]/min via INTRAVENOUS
  Filled 2016-04-26: qty 1000

## 2016-04-26 MED ORDER — PHENYLEPHRINE 40 MCG/ML (10ML) SYRINGE FOR IV PUSH (FOR BLOOD PRESSURE SUPPORT)
80.0000 ug | PREFILLED_SYRINGE | INTRAVENOUS | Status: DC | PRN
  Filled 2016-04-26: qty 10
  Filled 2016-04-26: qty 5

## 2016-04-26 MED ORDER — LACTATED RINGERS IV SOLN: 500.0000 mL | Freq: Once | INTRAVENOUS | Status: DC

## 2016-04-26 MED ORDER — FENTANYL 2.5 MCG/ML BUPIVACAINE 1/10 % EPIDURAL INFUSION (WH - ANES): 14.0000 mL/h | INTRAMUSCULAR | Status: DC | PRN

## 2016-04-26 MED ORDER — FERROUS SULFATE 325 (65 FE) MG PO TABS
325.0000 mg | ORAL_TABLET | Freq: Every day | ORAL | Status: DC
  Administered 2016-04-27: 325 mg via ORAL
  Filled 2016-04-26: qty 1

## 2016-04-26 MED ORDER — PHENYLEPHRINE 40 MCG/ML (10ML) SYRINGE FOR IV PUSH (FOR BLOOD PRESSURE SUPPORT)
80.0000 ug | PREFILLED_SYRINGE | INTRAVENOUS | Status: DC | PRN
  Filled 2016-04-26: qty 5

## 2016-04-26 MED ORDER — IBUPROFEN 600 MG PO TABS
600.0000 mg | ORAL_TABLET | Freq: Four times a day (QID) | ORAL | Status: DC
  Administered 2016-04-26 – 2016-04-27 (×5): 600 mg via ORAL
  Filled 2016-04-26 (×5): qty 1

## 2016-04-26 MED ORDER — DIBUCAINE 1 % RE OINT: 1.0000 "application " | TOPICAL_OINTMENT | RECTAL | Status: DC | PRN

## 2016-04-26 MED ORDER — OXYCODONE-ACETAMINOPHEN 5-325 MG PO TABS: 1.0000 | ORAL_TABLET | ORAL | Status: DC | PRN

## 2016-04-26 MED ORDER — PRENATAL MULTIVITAMIN CH
1.0000 | ORAL_TABLET | Freq: Every day | ORAL | Status: DC
  Administered 2016-04-27: 1 via ORAL
  Filled 2016-04-26: qty 1

## 2016-04-26 MED ORDER — OXYCODONE-ACETAMINOPHEN 5-325 MG PO TABS: 2.0000 | ORAL_TABLET | ORAL | Status: DC | PRN

## 2016-04-26 MED ORDER — ZOLPIDEM TARTRATE 5 MG PO TABS: 5.0000 mg | ORAL_TABLET | Freq: Every evening | ORAL | Status: DC | PRN

## 2016-04-26 MED ORDER — ACETAMINOPHEN 325 MG PO TABS
650.0000 mg | ORAL_TABLET | ORAL | Status: DC | PRN
  Administered 2016-04-26 – 2016-04-27 (×4): 650 mg via ORAL
  Filled 2016-04-26 (×4): qty 2

## 2016-04-26 NOTE — Anesthesia Procedure Notes (Signed)
Epidural Patient location during procedure: OB Start time: 04/26/2016 3:02 PM End time: 04/26/2016 3:13 PM  Staffing Anesthesiologist: Chaney MallingHODIERNE, Fantasia Jinkins Performed: anesthesiologist   Preanesthetic Checklist Completed: patient identified, site marked, pre-op evaluation, timeout performed, IV checked, risks and benefits discussed and monitors and equipment checked  Epidural Patient position: sitting Prep: DuraPrep Patient monitoring: heart rate, cardiac monitor, continuous pulse ox and blood pressure Approach: midline Location: L2-L3 Injection technique: LOR saline  Needle:  Needle type: Tuohy  Needle gauge: 17 G Needle length: 9 cm Needle insertion depth: 6 cm Catheter type: closed end flexible Catheter size: 19 Gauge Catheter at skin depth: 11 cm Test dose: negative and Other  Assessment Events: blood not aspirated, injection not painful, no injection resistance and negative IV test  Additional Notes Informed consent obtained prior to proceeding including risk of failure, 1% risk of PDPH, risk of minor discomfort and bruising.  Discussed rare but serious complications including epidural abscess, permanent nerve injury, epidural hematoma.  Discussed alternatives to epidural analgesia and patient desires to proceed.  Timeout performed pre-procedure verifying patient name, procedure, and platelet count.  Patient tolerated procedure well. Reason for block:procedure for pain

## 2016-04-26 NOTE — Anesthesia Pain Management Evaluation Note (Signed)
  CRNA Pain Management Visit Note  Patient: Leslie Simpson, 26 y.o., female  "Hello I am a [facility] the [facility]. We have an anesthesia team available at all times to provide care throughout the hospital, including epidural management and anesthesia for C-section. I don't know your plan for the delivery whether it a natural birth, water birth, IV sedation, nitrous supplementation, doula or epidural, but we want to meet your pain goals."   1.Was your pain managed to your expectations on prior hospitalizations?   Yes   2.What is your expectation for pain management during this hospitalization?     Labor support without medications or IV meds  3.How can we help you reach that goal? Be available  Record the patient's initial score and the patient's pain goal.   Pain: 3  Pain Goal: 6 [facility] wants you to be able to say your pain was always managed very well.  Edison PaceWILKERSON,Mickey Esguerra 04/26/2016

## 2016-04-26 NOTE — H&P (Signed)
LABOR AND DELIVERY ADMISSION HISTORY AND PHYSICAL NOTE  Leslie Simpson is a 26 y.o. female G3P2002 with IUP at [redacted]w[redacted]d by LMP presenting for IOL for postdates. She was seen yesterday at MAU for concern for ROM.  She was discharged home. Reported contractions throughout the night and arrived for her scheduled induction. She reports positive fetal movement. She denies leakage of fluid or vaginal bleeding.   Prenatal History/Complications:  Past Medical History: Past Medical History:  Diagnosis Date  . Abnormal pap 2012    Past Surgical History: Past Surgical History:  Procedure Laterality Date  . CESAREAN SECTION    . HERNIA REPAIR  2001   umbilical    Obstetrical History: OB History    Gravida Para Term Preterm AB Living   3 2 2     2    SAB TAB Ectopic Multiple Live Births                  Social History: Social History   Social History  . Marital status: Single    Spouse name: N/A  . Number of children: N/A  . Years of education: N/A   Social History Main Topics  . Smoking status: Never Smoker  . Smokeless tobacco: Never Used  . Alcohol use Yes     Comment: social drinking  . Drug use: No  . Sexual activity: Yes    Partners: Male    Birth control/ protection: Condom   Other Topics Concern  . None   Social History Narrative  . None    Family History: Family History  Problem Relation Age of Onset  . Asthma Mother   . Hypertension Father   . Diabetes Father     Allergies: Allergies  Allergen Reactions  . Oysters [Shellfish Allergy] Hives  . Raspberry Hives and Swelling    Prescriptions Prior to Admission  Medication Sig Dispense Refill Last Dose  . ferrous sulfate (FERROUSUL) 325 (65 FE) MG tablet Take 1 tablet (325 mg total) by mouth 2 (two) times daily. (Patient taking differently: Take 325 mg by mouth daily with breakfast. ) 60 tablet 3 04/24/2016     Review of Systems   All systems reviewed and negative except as stated in HPI  Blood  pressure 117/78, pulse 77, temperature 97.9 F (36.6 C), temperature source Oral, resp. rate 20, last menstrual period 07/14/2015. General appearance: alert Lungs: clear to auscultation bilaterally Heart: regular rate and rhythm Abdomen: soft, non-tender; bowel sounds normal Extremities: No calf swelling or tenderness Presentation: cephalic Fetal monitoring:  FHR 140s, mod variability, pos accels, no decels, contractions q1.5-4.14min Uterine activity:  Dilation: 3 Effacement (%): 70 Station: -2 Exam by:: Enis Slipper, RN   Prenatal labs: ABO, Rh: --/--/O POS (02/01 0745) Antibody: NEG (02/01 0745) Rubella: immune RPR: NON REAC (11/02 1104)  HBsAg: NEGATIVE (09/07 1159)  HIV: NONREACTIVE (11/02 1104)  GBS: Negative (01/03 0000)  1 hr Glucola: early 117 and third 93 Genetic screening:  normal Anatomy US: normal  Prenatal Transfer Tool  Maternal Diabetes: No Genetic Screening: Normal Maternal Ultrasounds/Referrals: Normal Fetal Ultrasounds or other Referrals:  None Maternal Substance Abuse:  No Significant Maternal Medications:  None Significant Maternal Lab Results: None  Results for orders placed or performed during the hospital encounter of 04/26/16 (from the past 24 hour(s))  CBC   Collection Time: 04/26/16  7:45 AM  Result Value Ref Range   WBC 9.0 4.0 - 10.5 K/uL   RBC 4.34 3.87 - 5.11 MIL/uL   Hemoglobin  10.8 (L) 12.0 - 15.0 g/dL   HCT 21.332.5 (L) 08.636.0 - 57.846.0 %   MCV 74.9 (L) 78.0 - 100.0 fL   MCH 24.9 (L) 26.0 - 34.0 pg   MCHC 33.2 30.0 - 36.0 g/dL   RDW 46.916.6 (H) 62.911.5 - 52.815.5 %   Platelets 171 150 - 400 K/uL  Type and screen   Collection Time: 04/26/16  7:45 AM  Result Value Ref Range   ABO/RH(D) O POS    Antibody Screen NEG    Sample Expiration 04/29/2016     Patient Active Problem List   Diagnosis Date Noted  . Post-dates pregnancy 04/26/2016  . Poor weight gain of pregnancy, second trimester 01/26/2016  . Anemia of mother in pregnancy, antepartum  12/04/2015  . Supervision of normal pregnancy 12/01/2015  . Previous cesarean delivery, antepartum 12/01/2015  . LGSIL (low grade squamous intraepithelial lesion) on Pap smear 09/23/2010    Assessment: Leslie Simpson is a 26 y.o. G3P2002 at 4669w0d here for IOL for postdates.    #Labor: IOL with pitocin currently at 9. Continue expectant management, anticipate SVD #Pain:  IV pain medication #FWB:  Cat 1 #ID: GBS neg #MOF: bottle #MOC: Wants tubal didn't sign consent   Renne Muscaaniel L Warden, MD PGY-1 04/26/2016, 9:49 AM  OB FELLOW HISTORY AND PHYSICAL ATTESTATION  I have seen and examined this patient; I agree with above documentation in the resident's note.    Ernestina Pennaicholas Schenk 04/26/2016, 12:27 PM

## 2016-04-26 NOTE — Anesthesia Postprocedure Evaluation (Signed)
Anesthesia Post Note  Patient: Leslie Simpson  Procedure(s) Performed: * No procedures listed *  Patient location during evaluation: Mother Baby Anesthesia Type: Epidural Level of consciousness: awake and alert Pain management: satisfactory to patient Vital Signs Assessment: post-procedure vital signs reviewed and stable Respiratory status: respiratory function stable Cardiovascular status: stable Postop Assessment: no headache, no backache, epidural receding, patient able to bend at knees, no signs of nausea or vomiting and adequate PO intake Anesthetic complications: no        Last Vitals:  Vitals:   04/26/16 1800 04/26/16 1908  BP: (!) 141/55 136/65  Pulse: 75 77  Resp: 18 18  Temp: 36.9 C 36.9 C    Last Pain:  Vitals:   04/26/16 1908  TempSrc: Oral  PainSc: 3    Pain Goal: Patients Stated Pain Goal: 2 (04/26/16 1908)               Karleen DolphinFUSSELL,Demani Mcbrien

## 2016-04-26 NOTE — Anesthesia Preprocedure Evaluation (Signed)
Anesthesia Evaluation  Patient identified by MRN, date of birth, ID band Patient awake    Reviewed: Allergy & Precautions, H&P , NPO status , Patient's Chart, lab work & pertinent test results  Airway Mallampati: II   Neck ROM: full    Dental   Pulmonary neg pulmonary ROS,    breath sounds clear to auscultation       Cardiovascular negative cardio ROS   Rhythm:regular Rate:Normal     Neuro/Psych    GI/Hepatic   Endo/Other    Renal/GU      Musculoskeletal   Abdominal   Peds  Hematology   Anesthesia Other Findings   Reproductive/Obstetrics (+) Pregnancy                             Anesthesia Physical Anesthesia Plan  ASA: I  Anesthesia Plan: Epidural   Post-op Pain Management:    Induction:   Airway Management Planned: Natural Airway  Additional Equipment:   Intra-op Plan:   Post-operative Plan:   Informed Consent: I have reviewed the patients History and Physical, chart, labs and discussed the procedure including the risks, benefits and alternatives for the proposed anesthesia with the patient or authorized representative who has indicated his/her understanding and acceptance.     Plan Discussed with: Anesthesiologist  Anesthesia Plan Comments:         Anesthesia Quick Evaluation

## 2016-04-27 LAB — CBC
HCT: 24.2 % — ABNORMAL LOW (ref 36.0–46.0)
Hemoglobin: 8 g/dL — ABNORMAL LOW (ref 12.0–15.0)
MCH: 25.1 pg — ABNORMAL LOW (ref 26.0–34.0)
MCHC: 33.5 g/dL (ref 30.0–36.0)
MCV: 74.9 fL — ABNORMAL LOW (ref 78.0–100.0)
PLATELETS: 138 10*3/uL — AB (ref 150–400)
RBC: 3.23 MIL/uL — AB (ref 3.87–5.11)
RDW: 16.8 % — AB (ref 11.5–15.5)
WBC: 12.4 10*3/uL — AB (ref 4.0–10.5)

## 2016-04-27 MED ORDER — DOCUSATE SODIUM 100 MG PO CAPS: 100.0000 mg | ORAL_CAPSULE | Freq: Two times a day (BID) | ORAL | 0 refills | Status: DC

## 2016-04-27 MED ORDER — IBUPROFEN 600 MG PO TABS: 600.0000 mg | ORAL_TABLET | Freq: Four times a day (QID) | ORAL | 0 refills | Status: DC | PRN

## 2016-04-27 NOTE — Discharge Summary (Signed)
OB Discharge Summary     Patient Name: Leslie Simpson DOB: [birth year] MRN: [id number]  Date of admission: 04/26/2016 Delivering MD: Renne MuscaWARDEN, DANIEL L   Date of discharge: 04/27/2016  Admitting diagnosis: 8158w1d, Induction  Intrauterine pregnancy: 951w0d     Secondary diagnosis:  Active Problems:   Post-dates pregnancy  Additional problems: none     Discharge diagnosis: Term Pregnancy Delivered and Anemia                                                                                                Post partum procedures:none  Augmentation: AROM and Pitocin  Complications: None  Hospital course:  Induction of Labor With Vaginal Delivery   26 y.o. yo G3P3003 at 9251w0d was admitted to the hospital 04/26/2016 for induction of labor.  Indication for induction: Postdates.  Patient had an uncomplicated labor course as follows: Membrane Rupture Time/Date: 3:25 PM ,04/26/2016   Intrapartum Procedures: Episiotomy: None [1]                                         Lacerations:  None [1]  Patient had delivery of a Viable infant.  Information for the patient's newborn:  Kimber RelicSmith, Girl Vivien [[id number]][[id number]]  Delivery Method: Vaginal, Spontaneous Delivery (Filed from Delivery Summary)   04/26/2016  Details of delivery can be found in separate delivery note.  Patient had a routine postpartum course. Patient is discharged home 04/27/16.  Physical exam  Vitals:   04/26/16 1800 04/26/16 1908 04/26/16 2248 04/27/16 0553  BP: (!) 141/55 136/65 (!) 121/59 121/66  Pulse: 75 77 84 73  Resp: 18 18 18 18   Temp: 98.5 F (36.9 C) 98.4 F (36.9 C) 97.9 F (36.6 C) 98 F (36.7 C)  TempSrc: Oral Oral Oral Oral  SpO2: 100% 100% 100%    General: alert and cooperative Lochia: appropriate Uterine Fundus: firm Incision: N/A DVT Evaluation: No evidence of DVT seen on physical exam. Labs: Lab Results  Component Value Date   WBC 12.4 (H) 04/27/2016   HGB 8.0 (L) 04/27/2016   HCT 24.2 (L) 04/27/2016   MCV 74.9  (L) 04/27/2016   PLT 138 (L) 04/27/2016   No flowsheet data found.  Discharge instruction: per After Visit Summary and "Baby and Me Booklet".  After visit meds:  Allergies as of 04/27/2016      Reactions   Oysters [shellfish Allergy] Hives   Raspberry Hives, Swelling      Medication List    TAKE these medications   docusate sodium 100 MG capsule Commonly known as:  COLACE Take 1 capsule (100 mg total) by mouth 2 (two) times daily.   ferrous sulfate 325 (65 FE) MG tablet Commonly known as:  FERROUSUL Take 1 tablet (325 mg total) by mouth 2 (two) times daily. What changed:  when to take this   ibuprofen 600 MG tablet Commonly known as:  ADVIL,MOTRIN Take 1 tablet (600 mg total) by mouth every 6 (six) hours as needed for mild pain or moderate  pain.       Diet: routine diet  Activity: Advance as tolerated. Pelvic rest for 6 weeks.   Outpatient follow up:6 weeks Follow up Appt: Future Appointments Date Time Provider Department Center  06/07/2016 1:20 PM Lorne Skeens, MD [facility]   Follow up Visit:No Follow-up on file.  Postpartum contraception:  Interval tubal. Needs to sign consent  Newborn Data: Live born female  Birth Weight: 8 lb 10.1 oz (3915 g) APGAR: 6, 9  Baby Feeding: Bottle Disposition:home with mother   04/27/2016 Renne Musca, MD   CNM attestation I have seen and examined this patient and agree with above documentation in the resident's note.   Crystalmarie Yasin is a 26 y.o. G3P3003 s/p SVD.   Pain is well controlled.  Plan for birth control is condoms.  Method of Feeding: bottle  PE:  BP 121/66 (BP Location: Left Arm)   Pulse 73   Temp 98 F (36.7 C) (Oral)   Resp 18   LMP 07/14/2015 (Exact Date)   SpO2 100%   Breastfeeding? Unknown  Fundus firm   Recent Labs  04/26/16 0745 04/27/16 0641  HGB 10.8* 8.0*  HCT 32.5* 24.2*     Plan: discharge today - postpartum care discussed - f/u clinic in 4-6 weeks for postpartum  visit   Cam Hai, CNM 3:37 PM  04/27/2016

## 2016-06-07 ENCOUNTER — Encounter: Payer: Self-pay | Admitting: *Deleted

## 2016-06-07 ENCOUNTER — Ambulatory Visit: Payer: Medicaid Other | Admitting: Obstetrics and Gynecology

## 2016-06-07 NOTE — Progress Notes (Signed)
Leslie Simpson missed her scheduled postpartum visit.  Will send letter to notify her.

## 2016-06-18 ENCOUNTER — Ambulatory Visit (INDEPENDENT_AMBULATORY_CARE_PROVIDER_SITE_OTHER): Payer: Medicaid Other | Admitting: Medical

## 2016-06-18 NOTE — Patient Instructions (Addendum)
Laparoscopic Tubal Ligation Laparoscopic tubal ligation is a procedure to close the fallopian tubes. This is done so that you cannot get pregnant. When the fallopian tubes are closed, the eggs that your ovaries release cannot enter the uterus, and sperm cannot reach the released eggs. A laparoscopic tubal ligation is sometimes called "getting your tubes tied." You should not have this procedure if you want to get pregnant someday or if you are unsure about having more children. Tell a health care provider about:  Any allergies you have.  All medicines you are taking, including vitamins, herbs, eye drops, creams, and over-the-counter medicines.  Any problems you or family members have had with anesthetic medicines.  Any blood disorders you have.  Any surgeries you have had.  Any medical conditions you have.  Whether you are pregnant or may be pregnant.  Any past pregnancies. What are the risks? Generally, this is a safe procedure. However, problems may occur, including:  Infection.  Bleeding.  Injury to surrounding organs.  Side effects from anesthetics.  Failure of the procedure. This procedure can increase your risk of a kind of pregnancy in which a fertilized egg attaches to the outside of the uterus (ectopic pregnancy). What happens before the procedure?  Ask your health care provider about:  Changing or stopping your regular medicines. This is especially important if you are taking diabetes medicines or blood thinners.  Taking medicines such as aspirin and ibuprofen. These medicines can thin your blood. Do not take these medicines before your procedure if your health care provider instructs you not to.  Follow instructions from your health care provider about eating and drinking restrictions.  Plan to have someone take you home after the procedure.  If you go home right after the procedure, plan to have someone with you for 24 hours. What happens during the  procedure?  You will be given one or more of the following:  A medicine to help you relax (sedative).  A medicine to numb the area (local anesthetic).  A medicine to make you fall asleep (general anesthetic).  A medicine that is injected into an area of your body to numb everything below the injection site (regional anesthetic).  An IV tube will be inserted into one of your veins. It will be used to give you medicines and fluids during the procedure.  Your bladder may be emptied with a small tube (catheter).  If you have been given a general anesthetic, a tube will be put down your throat to help you breathe.  One small cut (incisions) will be made in your belly button.  Your abdomen will be inflated with a gas. This will let the surgeon see better and will give the surgeon room to work.  A thin, lighted tube (laparoscope) with a camera attached will be inserted into your abdomen through the incision. Small instruments will also be inserted through the incision.  The fallopian tubes will be tied off, burned (cauterized), or blocked with a clip, ring, or clamp. A small portion in the center of each fallopian tube may be removed.  The gas will be released from the abdomen.  The incision will be closed with stitches (sutures).  A bandage (dressing) will be placed over the incisions The procedure may vary among health care providers and hospitals. What happens after the procedure?  Your blood pressure, heart rate, breathing rate, and blood oxygen level will be monitored often until the medicines you were given have worn off.  You will  be given medicine to help with pain, nausea, and vomiting as needed. This information is not intended to replace advice given to you by your health care provider. Make sure you discuss any questions you have with your health care provider. Document Released: 06/18/2000 Document Revised: 08/18/2015 Document Reviewed: 02/20/2015 Elsevier Interactive  Patient Education  2017 [employer].

## 2016-06-18 NOTE — Progress Notes (Signed)
Subjective:     Leslie Simpson is a 26 y.o. female who presents for a postpartum visit. She is 7 weeks postpartum following a spontaneous vaginal delivery. I have fully reviewed the prenatal and intrapartum course. The delivery was at 41 gestational weeks. Outcome: spontaneous vaginal delivery. Anesthesia: epidural. Postpartum course has been normal. Baby's course has been normal. Baby is feeding by bottle - Similac Sensitive RS. Bleeding staining only. Bowel function is normal. Bladder function is normal. Patient is sexually active. Contraception method is condoms. Postpartum depression screening: negative.  The following portions of the patient's history were reviewed and updated as appropriate: allergies, current medications, past family history, past medical history, past social history, past surgical history and problem list.  Review of Systems Pertinent items are noted in HPI.   Objective:    BP 101/66   Pulse 64   Ht 5\' 1"  (1.549 m)   Wt 187 lb (84.8 kg)   BMI 35.33 kg/m   General:  alert and cooperative   Breasts:  Not performed  Lungs: clear to auscultation bilaterally  Heart:  regular rate and rhythm, S1, S2 normal, no murmur, click, rub or gallop  Abdomen: soft, non-tender; bowel sounds normal; no masses,  no organomegaly   Vulva:  not evaluated  Vagina: not evaluated  Cervix:  not evaluated  Corpus: not examined  Adnexa:  not evaluated  Rectal Exam: Not performed.        Assessment:     Normal postpartum exam. Pap smear not done at today's visit.  Normal pap during pregnancy  Plan:    1. Contraception: may desire BTL. Dr. Macon LargeAnyanwu to discuss procedure with the patient. Declines other birth control at this time. Patient left without completing BTL papers properly. Patient was informed papers were not filled out correctly by her, but left anyway. Will need to return to sign papers when/if she decides to schedule BTL, but pre-operative consult was complete.  2. Follow up as  needed    Marny LowensteinJulie N Kirt Chew, PA-C  06/18/2016 10:38 AM

## 2016-06-18 NOTE — Progress Notes (Signed)
Discussed details of laparoscopic BTS, all questions answered.  Risks and benefits discussed in detail including but not limited to: risk of regret, permanence of method, bleeding, infection, injury to surrounding organs and need for additional procedures.  Failure risk of 1% for Filshie clips with increased risk of ectopic gestation if pregnancy occurs was also discussed with patient.    Medicaid papers signed today, patient understands that surgery will be scheduled at least 30 days after the day papers are signed as per Medicaid guidelines. In the meantime, patient will use condoms for contraception prior to surgery.  Patient will think about this; also think about alternative of vasectomy.  Once she decides to go ahead, she will call back and will be scheduled accordingly.    Jaynie CollinsUGONNA  Takeisha Cianci, MD, FACOG Attending Obstetrician & Gynecologist, [facility], [facility]

## 2017-03-11 ENCOUNTER — Encounter (HOSPITAL_BASED_OUTPATIENT_CLINIC_OR_DEPARTMENT_OTHER): Payer: Self-pay | Admitting: Emergency Medicine

## 2017-03-11 ENCOUNTER — Emergency Department (HOSPITAL_BASED_OUTPATIENT_CLINIC_OR_DEPARTMENT_OTHER)
Admission: EM | Admit: 2017-03-11 | Discharge: 2017-03-11 | Disposition: A | Discharge status: Home / Self Care | Payer: BLUE CROSS/BLUE SHIELD | Attending: Emergency Medicine | Admitting: Emergency Medicine

## 2017-03-11 ENCOUNTER — Other Ambulatory Visit: Payer: Self-pay

## 2017-03-11 DIAGNOSIS — N939 Abnormal uterine and vaginal bleeding, unspecified: Secondary | ICD-10-CM | POA: Diagnosis present

## 2017-03-11 DIAGNOSIS — Z79899 Other long term (current) drug therapy: Secondary | ICD-10-CM | POA: Diagnosis not present

## 2017-03-11 DIAGNOSIS — B9689 Other specified bacterial agents as the cause of diseases classified elsewhere: Secondary | ICD-10-CM

## 2017-03-11 DIAGNOSIS — R8290 Unspecified abnormal findings in urine: Secondary | ICD-10-CM | POA: Insufficient documentation

## 2017-03-11 DIAGNOSIS — N760 Acute vaginitis: Secondary | ICD-10-CM | POA: Diagnosis not present

## 2017-03-11 LAB — BASIC METABOLIC PANEL
ANION GAP: 6 (ref 5–15)
BUN: 15 mg/dL (ref 6–20)
CALCIUM: 8.8 mg/dL — AB (ref 8.9–10.3)
CHLORIDE: 107 mmol/L (ref 101–111)
CO2: 25 mmol/L (ref 22–32)
Creatinine, Ser: 0.6 mg/dL (ref 0.44–1.00)
GFR calc non Af Amer: >60 mL/min (ref >60)
GLUCOSE: 107 mg/dL — AB (ref 65–99)
POTASSIUM: 3.7 mmol/L (ref 3.5–5.1)
Sodium: 138 mmol/L (ref 135–145)

## 2017-03-11 LAB — URINALYSIS, ROUTINE W REFLEX MICROSCOPIC
Bilirubin Urine: NEGATIVE
GLUCOSE, UA: NEGATIVE mg/dL
KETONES UR: NEGATIVE mg/dL
NITRITE: POSITIVE — AB
PH: 6 (ref 5.0–8.0)
Protein, ur: NEGATIVE mg/dL
Specific Gravity, Urine: >1.03 — ABNORMAL HIGH (ref 1.005–1.030)

## 2017-03-11 LAB — URINALYSIS, MICROSCOPIC (REFLEX)

## 2017-03-11 LAB — CBC
HEMATOCRIT: 30.9 % — AB (ref 36.0–46.0)
HEMOGLOBIN: 10.1 g/dL — AB (ref 12.0–15.0)
MCH: 23.9 pg — ABNORMAL LOW (ref 26.0–34.0)
MCHC: 32.7 g/dL (ref 30.0–36.0)
MCV: 73.2 fL — AB (ref 78.0–100.0)
Platelets: 250 10*3/uL (ref 150–400)
RBC: 4.22 MIL/uL (ref 3.87–5.11)
RDW: 14.5 % (ref 11.5–15.5)
WBC: 8.1 10*3/uL (ref 4.0–10.5)

## 2017-03-11 LAB — WET PREP, GENITAL
SPERM: NONE SEEN
TRICH WET PREP: NONE SEEN
YEAST WET PREP: NONE SEEN

## 2017-03-11 LAB — PREGNANCY, URINE: Preg Test, Ur: NEGATIVE

## 2017-03-11 MED ORDER — CEPHALEXIN 500 MG PO CAPS: 500.0000 mg | ORAL_CAPSULE | Freq: Two times a day (BID) | ORAL | 0 refills | Status: DC

## 2017-03-11 MED ORDER — METRONIDAZOLE 500 MG PO TABS: 500.0000 mg | ORAL_TABLET | Freq: Two times a day (BID) | ORAL | 0 refills | Status: DC

## 2017-03-11 NOTE — ED Triage Notes (Signed)
Pt having heavy periods since February.  Pt states she has appointment for GYN in January.  Pt states she passed a large blood clot today and concerned her.

## 2017-03-11 NOTE — ED Provider Notes (Signed)
[facility] EMERGENCY DEPARTMENT Provider Note   CSN: [id number] Arrival date & time: 03/11/17  [id number]     History   Chief Complaint Chief Complaint  Patient presents with  . Vaginal Bleeding    HPI Leslie Simpson is a 26 y.o. female.  Patient presents to the emergency department with a chief complaint of vaginal bleeding.  Reports having heavier than normal periods since giving birth back in February 2018.  She reports some abdominal discomfort when she is on her periods.  She states that she saw her primary care doctor several weeks ago for the same, and was advised to follow-up with her OB/GYN.  She states that she became more concerned today, when she passed a large blood clot.  She reports that she has felt lightheaded and fatigued.  She denies dysuria or vaginal discharge.  She reports using multiple pads/tampons per day when menstruating.  She reports that recent blood work showed anemia, low iron, and low magnesium.  She has a follow-up appointment with her OB/GYN in January.   The history is provided by the patient. No language interpreter was used.    Past Medical History:  Diagnosis Date  . Abnormal pap 2012    Patient Active Problem List   Diagnosis Date Noted  . Previous cesarean delivery, delivered 12/01/2015  . LGSIL (low grade squamous intraepithelial lesion) on Pap smear 09/23/2010    Past Surgical History:  Procedure Laterality Date  . CESAREAN SECTION    . HERNIA REPAIR  2001   umbilical    OB History    Gravida Para Term Preterm AB Living   3 3 3     3    SAB TAB Ectopic Multiple Live Births         0 1       Home Medications    Prior to Admission medications   Medication Sig Start Date End Date Taking? Authorizing Provider  docusate sodium (COLACE) 100 MG capsule Take 1 capsule (100 mg total) by mouth 2 (two) times daily. Patient not taking: Reported on 06/18/2016 04/27/16   Renne MuscaWarden, Daniel L, MD  ferrous sulfate (FERROUSUL) 325 (65  FE) MG tablet Take 1 tablet (325 mg total) by mouth 2 (two) times daily. Patient not taking: Reported on 06/18/2016 12/05/15   Marlis EdelsonKarim, Walidah N, CNM  ibuprofen (ADVIL,MOTRIN) 600 MG tablet Take 1 tablet (600 mg total) by mouth every 6 (six) hours as needed for mild pain or moderate pain. Patient not taking: Reported on 06/18/2016 04/27/16   Renne MuscaWarden, Daniel L, MD    Family History Family History  Problem Relation Age of Onset  . Asthma Mother   . Hypertension Father   . Diabetes Father     Social History Social History   Tobacco Use  . Smoking status: Never Smoker  . Smokeless tobacco: Never Used  Substance Use Topics  . Alcohol use: Yes    Comment: social drinking  . Drug use: No     Allergies   Oysters [shellfish allergy] and Raspberry   Review of Systems Review of Systems  All other systems reviewed and are negative.    Physical Exam Updated Vital Signs BP 120/77 (BP Location: Right Arm)   Pulse 74   Temp 97.9 F (36.6 C) (Oral)   Resp 18   Ht 5' (1.524 m)   Wt 80.7 kg (178 lb)   SpO2 100%   BMI 34.76 kg/m   Physical Exam  Constitutional: She is oriented to person,  place, and time. She appears well-developed and well-nourished.  HENT:  Head: Normocephalic and atraumatic.  Eyes: Conjunctivae and EOM are normal. Pupils are equal, round, and reactive to light.  Neck: Normal range of motion. Neck supple.  Cardiovascular: Normal rate and regular rhythm. Exam reveals no gallop and no friction rub.  No murmur heard. Pulmonary/Chest: Effort normal and breath sounds normal. No respiratory distress. She has no wheezes. She has no rales. She exhibits no tenderness.  Abdominal: Soft. Bowel sounds are normal. She exhibits no distension and no mass. There is no tenderness. There is no rebound and no guarding.  Musculoskeletal: Normal range of motion. She exhibits no edema or tenderness.  Neurological: She is alert and oriented to person, place, and time.  Skin: Skin is  warm and dry.  Psychiatric: She has a normal mood and affect. Her behavior is normal. Judgment and thought content normal.  Nursing note and vitals reviewed.    ED Treatments / Results  Labs (all labs ordered are listed, but only abnormal results are displayed) Labs Reviewed  WET PREP, GENITAL - Abnormal; Notable for the following components:      Result Value   Clue Cells Wet Prep HPF POC PRESENT (*)    WBC, Wet Prep HPF POC MODERATE (*)    All other components within normal limits  CBC - Abnormal; Notable for the following components:   Hemoglobin 10.1 (*)    HCT 30.9 (*)    MCV 73.2 (*)    MCH 23.9 (*)    All other components within normal limits  BASIC METABOLIC PANEL - Abnormal; Notable for the following components:   Glucose, Bld 107 (*)    Calcium 8.8 (*)    All other components within normal limits  URINALYSIS, ROUTINE W REFLEX MICROSCOPIC - Abnormal; Notable for the following components:   APPearance HAZY (*)    Specific Gravity, Urine >1.030 (*)    Hgb urine dipstick MODERATE (*)    Nitrite POSITIVE (*)    Leukocytes, UA MODERATE (*)    All other components within normal limits  URINALYSIS, MICROSCOPIC (REFLEX) - Abnormal; Notable for the following components:   Bacteria, UA MANY (*)    Squamous Epithelial / LPF 6-30 (*)    All other components within normal limits  PREGNANCY, URINE  GC/CHLAMYDIA PROBE AMP ([place]) NOT AT [facility]    EKG  EKG Interpretation None       Radiology No results found.  Procedures Procedures (including critical care time)  Medications Ordered in ED Medications - No data to display   Initial Impression / Assessment and Plan / ED Course  I have reviewed the triage vital signs and the nursing notes.  Pertinent labs & imaging results that were available during my care of the patient were reviewed by me and considered in my medical decision making (see chart for details).     Patient with ongoing heavy menstruation times  10 months.  Patient became more concerned when she passed a large blood clot this morning.  Vital signs are stable.  Will check blood counts and pelvic exam.  Anticipate follow-up with OB/GYN.  H/H is stable.  Clue cells on wet prep, will treat with flagyl.  Questionable UA, could be contaminate, but does report feeling like she has a UTI, will give keflex.  Final Clinical Impressions(s) / ED Diagnoses   Final diagnoses:  Vaginal bleeding  Abnormal urinalysis  BV (bacterial vaginosis)    ED Discharge Orders  Ordered    cephALEXin (KEFLEX) 500 MG capsule  2 times daily     03/11/17 1051    metroNIDAZOLE (FLAGYL) 500 MG tablet  2 times daily     03/11/17 1051       Roxy HorsemanBrowning, Cyara Devoto, PA-C 03/11/17 1053    Tegeler, Canary Brimhristopher J, MD 03/11/17 1743

## 2017-03-12 LAB — GC/CHLAMYDIA PROBE AMP (~~LOC~~) NOT AT ARMC
CHLAMYDIA, DNA PROBE: NEGATIVE
NEISSERIA GONORRHEA: POSITIVE — AB

## 2017-05-03 ENCOUNTER — Ambulatory Visit: Payer: Self-pay | Admitting: *Deleted

## 2018-04-08 IMAGING — US US MFM OB COMP +14 WKS
1 series · 14 of 28 positions shown · non-contrast
Comparison: none

[Series 1: us mfm ob comp +14 wks · 14 of 70 slices shown]
[im 3/70]
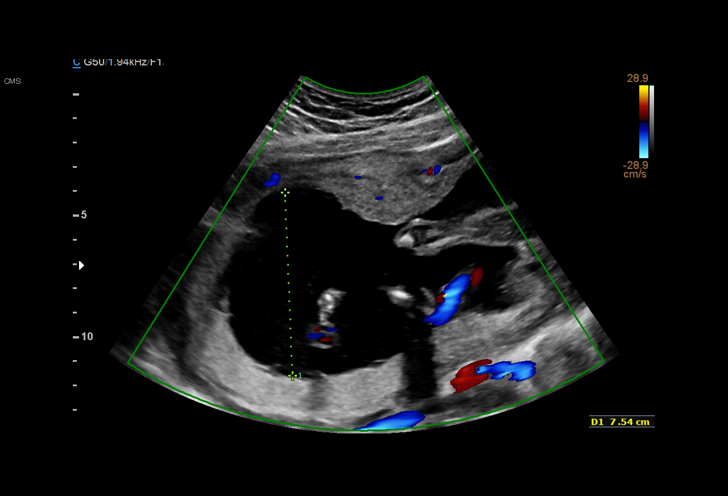
[im 8/70]
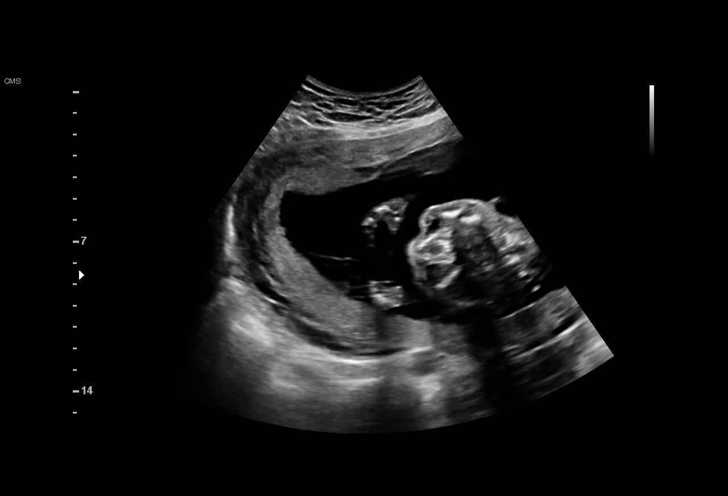
[im 13/70]
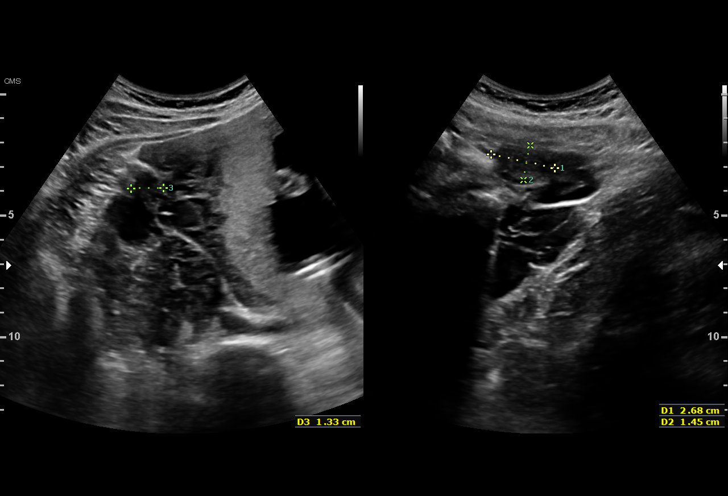
[im 18/70]
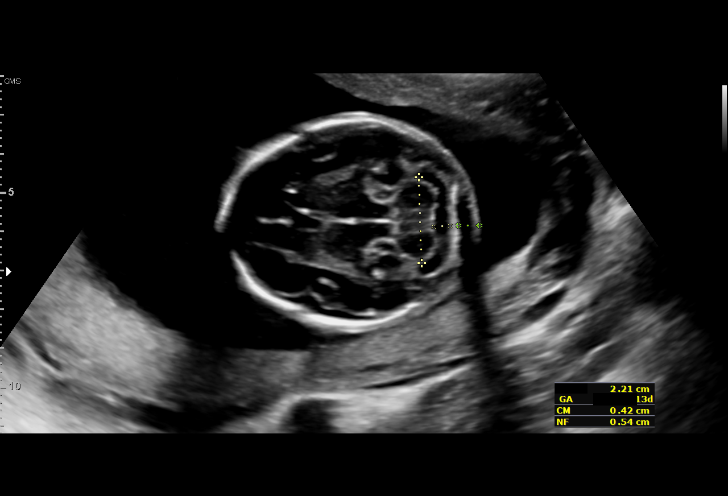
[im 24/70]
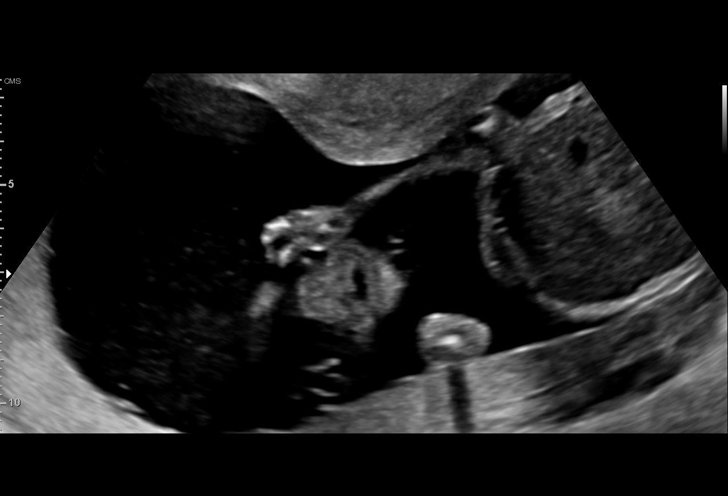
[im 29/70]
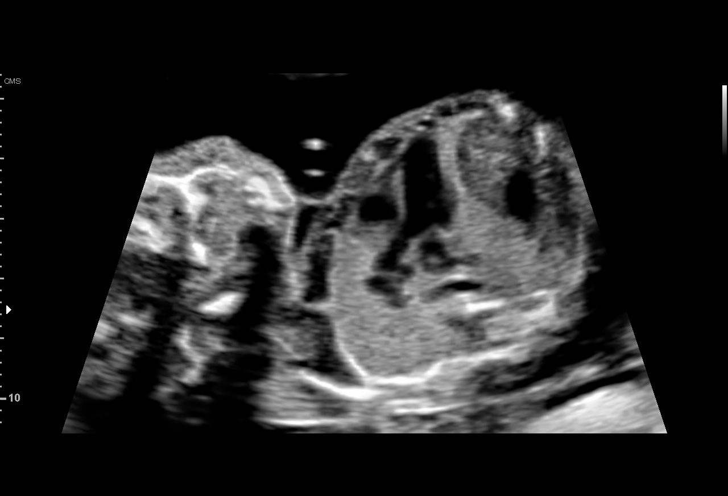
[im 34/70]
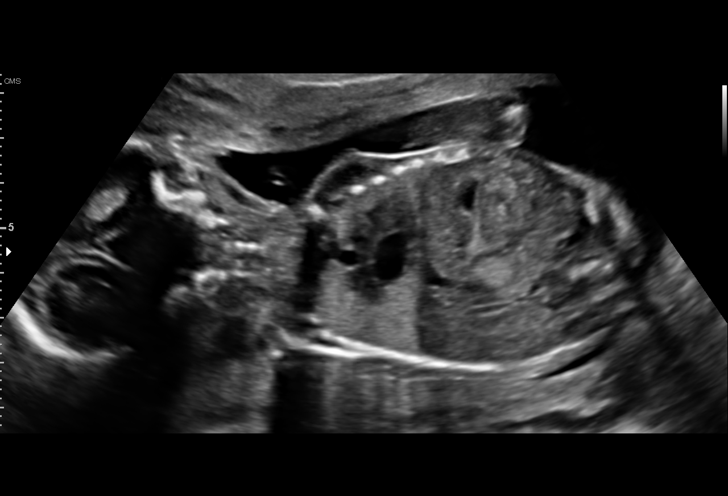
[im 39/70]
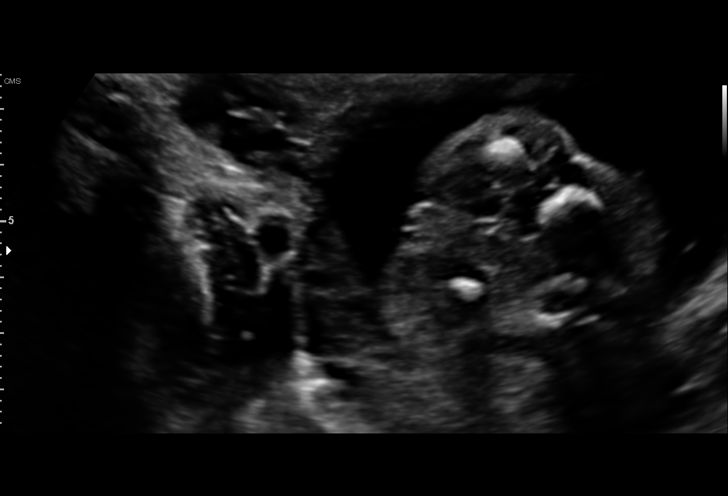
[im 44/70]
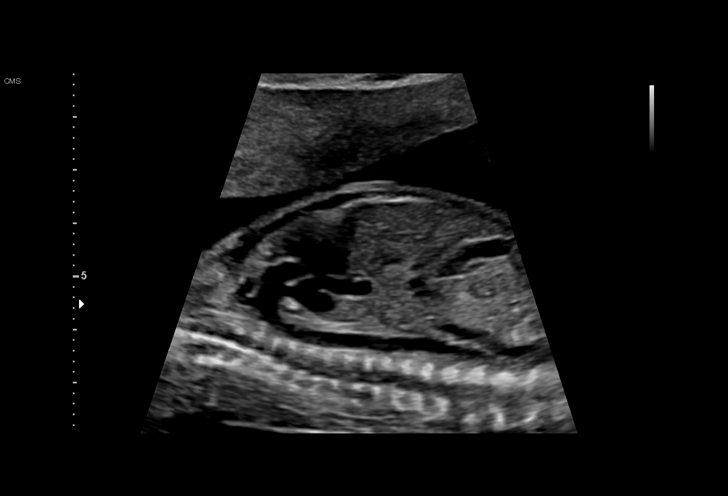
[im 49/70]
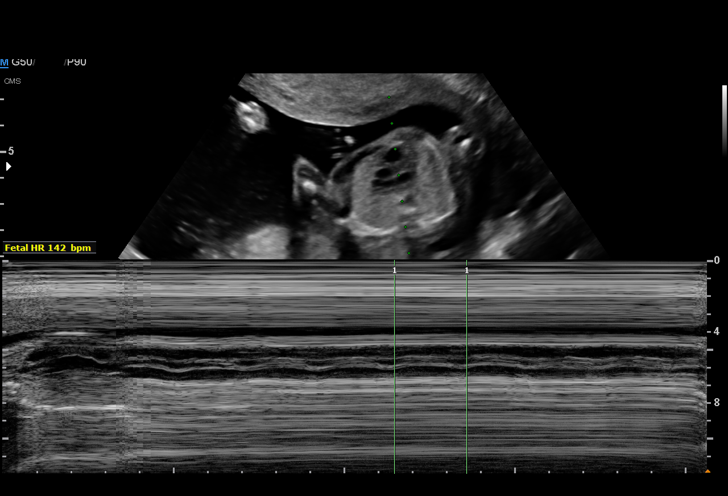
[im 54/70]
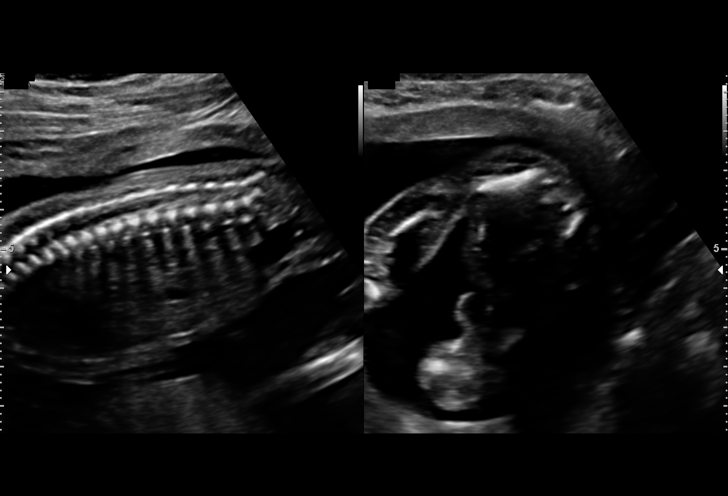
[im 59/70]
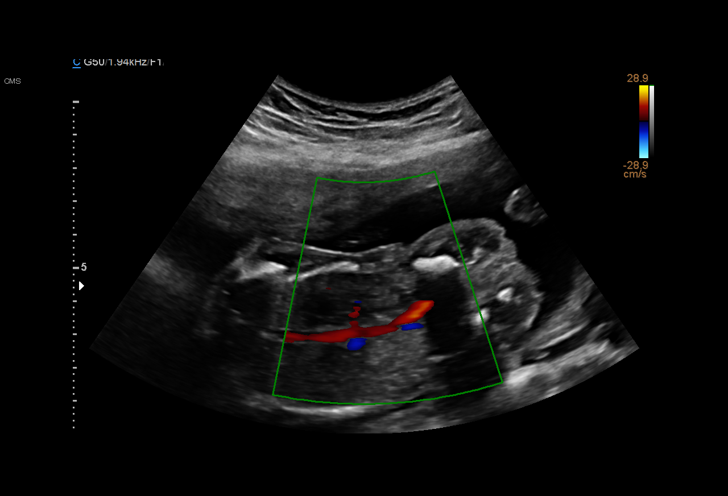
[im 64/70]
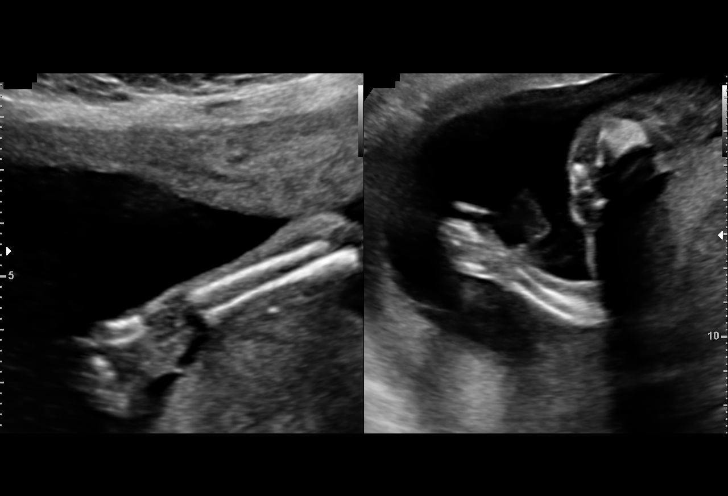
[im 70/70]
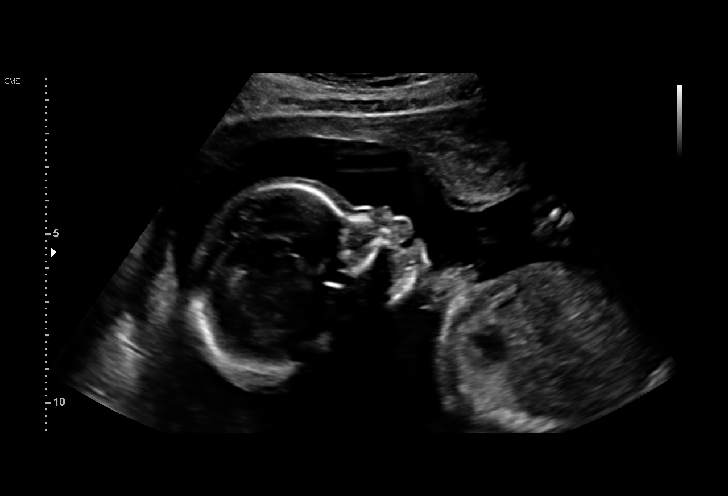

[14 of 28 positions shown; findings below may reference images not displayed]

1  MARCUS DELIMA            [id number]      [id number]     [id number]
Indications

20 weeks gestation of pregnancy
Basic anatomic survey                          Z36
Previous cesarean delivery, antepartum
OB History

Gravidity:    3         Term:   2        Prem:   0        SAB:   0
TOP:          0       Ectopic:  0        Living: 2
Fetal Evaluation

Num Of Fetuses:     1
Fetal Heart         142
Rate(bpm):
Cardiac Activity:   Observed
Presentation:       Breech
Placenta:           Posterior, above cervical os
P. Cord Insertion:  Visualized, central

Amniotic Fluid
AFI FV:      Subjectively within normal limits

Largest Pocket(cm)
7.54
Biometry

BPD:      51.3  mm     G. Age:  21w 4d         81  %    CI:         69.1   %   70 - 86
FL/HC:      17.5   %   15.9 -
HC:      197.1  mm     G. Age:  21w 6d         87  %    HC/AC:      1.19       1.06 -
AC:      165.8  mm     G. Age:  21w 4d         73  %    FL/BPD:     67.1   %
FL:       34.4  mm     G. Age:  20w 6d         45  %    FL/AC:      20.7   %   20 - 24
HUM:      33.6  mm     G. Age:  21w 3d         66  %
CER:      22.1  mm     G. Age:  21w 0d         56  %
NFT:       5.4  mm
CM:        4.2  mm

Est. FW:     416  gm    0 lb 15 oz      55  %
Gestational Age

LMP:           20w 5d       Date:   07/20/15                 EDD:   04/25/16
U/S Today:     21w 3d                                        EDD:   04/20/16
Best:          20w 5d    Det. By:   LMP  (07/20/15)          EDD:   04/25/16
Anatomy

Cranium:               Appears normal         Aortic Arch:            Appears normal
Cavum:                 Appears normal         Ductal Arch:            Appears normal
Ventricles:            Appears normal         Diaphragm:              Appears normal
Choroid Plexus:        Appears normal         Stomach:                Appears normal, left
sided
Cerebellum:            Appears normal         Abdomen:                Appears normal
Posterior Fossa:       Appears normal         Abdominal Wall:         Appears nml (cord
insert, abd wall)
Nuchal Fold:           Appears normal         Cord Vessels:           Appears normal (3
vessel cord)
Face:                  Appears normal         Kidneys:                Appear normal
(orbits and profile)
Lips:                  Appears normal         Bladder:                Appears normal
Thoracic:              Appears normal         Spine:                  Appears normal
Heart:                 Appears normal         Upper Extremities:      Appears normal
(4CH, axis, and
situs)
RVOT:                  Appears normal         Lower Extremities:      Appears normal
LVOT:                  Appears normal

Other:  Fetus appears to be a female. Heels and 5th digit visualized. Nasal
bone visualized.
Cervix Uterus Adnexa

Cervix
Length:              4  cm.
Normal appearance by transabdominal scan.

Uterus
No abnormality visualized.

Left Ovary
Not visualized.

Right Ovary
Within normal limits.

Cul De Sac:   No free fluid seen.

Adnexa:       No abnormality visualized.
Impression

SIUP at 20+5 weeks
Normal detailed fetal anatomy
Markers of aneuploidy: none
Normal amniotic fluid volume
Measurements consistent with LMP dating
Recommendations

Follow-up as clinically indicated

## 2019-08-31 ENCOUNTER — Other Ambulatory Visit: Payer: Self-pay

## 2019-08-31 ENCOUNTER — Encounter (HOSPITAL_COMMUNITY): Payer: Self-pay | Admitting: Emergency Medicine

## 2019-08-31 ENCOUNTER — Emergency Department (HOSPITAL_COMMUNITY)
Admission: EM | Admit: 2019-08-31 | Discharge: 2019-09-01 | Disposition: A | Discharge status: Home / Self Care | Payer: Medicaid Other | Attending: Emergency Medicine | Admitting: Emergency Medicine

## 2019-08-31 DIAGNOSIS — K0889 Other specified disorders of teeth and supporting structures: Secondary | ICD-10-CM | POA: Insufficient documentation

## 2019-08-31 NOTE — ED Provider Notes (Signed)
[facility]-EMERGENCY DEPT Provider Note   CSN: [id number] Arrival date & time: 08/31/19  2255     History Chief Complaint  Patient presents with  . Dental Pain    Leslie Simpson is a 29 y.o. female.  The history is provided by the patient and medical records.    29 y.o. F here with dental pain.  States pain started 3 days ago, has been worsening since then.  Pain localized to right upper and lower molars.  She denies any facial swelling.  No difficulty swallowing. No fever/chills.  She does have a dentist that she sees, cannot get an appt for 3 weeks.  She tried OTC orajel without relief.  Past Medical History:  Diagnosis Date  . Abnormal pap 2012    Patient Active Problem List   Diagnosis Date Noted  . Previous cesarean delivery, delivered 12/01/2015  . LGSIL (low grade squamous intraepithelial lesion) on Pap smear 09/23/2010    Past Surgical History:  Procedure Laterality Date  . CESAREAN SECTION    . HERNIA REPAIR  2001   umbilical     OB History    Gravida  3   Para  3   Term  3   Preterm      AB      Living  3     SAB      TAB      Ectopic      Multiple  0   Live Births  1           Family History  Problem Relation Age of Onset  . Asthma Mother   . Hypertension Father   . Diabetes Father     Social History   Tobacco Use  . Smoking status: Never Smoker  . Smokeless tobacco: Never Used  Substance Use Topics  . Alcohol use: Yes    Comment: social drinking  . Drug use: No    Home Medications Prior to Admission medications   Medication Sig Start Date End Date Taking? Authorizing Provider  cephALEXin (KEFLEX) 500 MG capsule Take 1 capsule (500 mg total) by mouth 2 (two) times daily. 03/11/17   Roxy Horseman, PA-C  Cholecalciferol (VITAMIN D3) 20 MCG TABS Take 2 tablets by mouth daily.    [provider]  ferrous sulfate (FERROUSUL) 325 (65 FE) MG tablet Take 1 tablet (325 mg total) by mouth 2  (two) times daily. Patient not taking: Reported on 06/18/2016 12/05/15   Amedeo Gory, CNM  ibuprofen (ADVIL,MOTRIN) 600 MG tablet Take 1 tablet (600 mg total) by mouth every 6 (six) hours as needed for mild pain or moderate pain. Patient not taking: Reported on 06/18/2016 04/27/16   Renne Musca, MD  metroNIDAZOLE (FLAGYL) 500 MG tablet Take 1 tablet (500 mg total) by mouth 2 (two) times daily. 03/11/17   Roxy Horseman, PA-C  UNKNOWN TO PATIENT     [provider]    Allergies    Laqueta Jean allergy] and Raspberry  Review of Systems   Review of Systems  HENT: Positive for dental problem.   All other systems reviewed and are negative.   Physical Exam Updated Vital Signs BP 121/77 (BP Location: Right Arm)   Pulse 84   Temp 99.1 F (37.3 C) (Oral)   Resp 14   Ht 5\' 1"  (1.549 m)   Wt 91.2 kg   SpO2 99%   BMI 37.98 kg/m   Physical Exam Vitals and nursing note reviewed.  Constitutional:      Appearance: She is well-developed.  HENT:     Head: Normocephalic and atraumatic.     Mouth/Throat:     Comments: Teeth largely in fair dentition, right upper 2nd molar with cavity developing along outer edge of tooth, right lower 3rd molar is broken along posterior aspect with decay noted, surrounding gingiva with some signs of gingivitis but no discrete abscess noted, handling secretions appropriately, no trismus, no facial or neck swelling, normal phonation without stridor Eyes:     Conjunctiva/sclera: Conjunctivae normal.     Pupils: Pupils are equal, round, and reactive to light.  Cardiovascular:     Rate and Rhythm: Normal rate and regular rhythm.     Heart sounds: Normal heart sounds.  Pulmonary:     Effort: Pulmonary effort is normal. No respiratory distress.     Breath sounds: Normal breath sounds. No rhonchi.  Abdominal:     General: Bowel sounds are normal.     Palpations: Abdomen is soft.     Tenderness: There is no abdominal tenderness. There  is no rebound.  Musculoskeletal:        General: Normal range of motion.     Cervical back: Normal range of motion.  Skin:    General: Skin is warm and dry.  Neurological:     Mental Status: She is alert and oriented to person, place, and time.     ED Results / Procedures / Treatments   Labs (all labs ordered are listed, but only abnormal results are displayed) Labs Reviewed - No data to display  EKG None  Radiology No results found.  Procedures Procedures (including critical care time)  Medications Ordered in ED Medications - No data to display  ED Course  I have reviewed the triage vital signs and the nursing notes.  Pertinent labs & imaging results that were available during my care of the patient were reviewed by me and considered in my medical decision making (see chart for details).    MDM Rules/Calculators/A&P  29 year old female presenting to the ED with right-sided dental pain.  States upper and lower teeth.  She tried using a Tylenol regularly.  She is afebrile and nontoxic in appearance.  Right lower 3rd molar is broken along posterior aspect, also has cavities of right upper 2nd molar.  No signs of dental abscess at this time.  She has no facial or neck swelling, handling secretions well, no stridor.  Not clinically concerning for ludwig's angina.  Will start on abx and have her follow-up with dentist.  She has appt in 3 weeks, but was given number for dentist on call if she would like to try and be seen sooner.  She may return here for any new/acute changes.  Final Clinical Impression(s) / ED Diagnoses Final diagnoses:  Pain, dental    Rx / DC Orders ED Discharge Orders         Ordered    naproxen (NAPROSYN) 500 MG tablet  2 times daily with meals     09/01/19 0036    ondansetron (ZOFRAN ODT) 4 MG disintegrating tablet  Every 8 hours PRN     09/01/19 0036    penicillin v potassium (VEETID) 500 MG tablet  3 times daily     09/01/19 0036             Larene Pickett, PA-C 09/01/19 3382    Rolland Porter, MD 09/01/19 [phone]

## 2019-09-01 MED ORDER — ONDANSETRON 4 MG PO TBDP
4.0000 mg | ORAL_TABLET | Freq: Once | ORAL | Status: AC
  Administered 2019-09-01: 4 mg via ORAL
  Filled 2019-09-01: qty 1

## 2019-09-01 MED ORDER — PENICILLIN V POTASSIUM 500 MG PO TABS: 500.0000 mg | ORAL_TABLET | Freq: Three times a day (TID) | ORAL | 0 refills | Status: DC

## 2019-09-01 MED ORDER — ONDANSETRON 4 MG PO TBDP: 4.0000 mg | ORAL_TABLET | Freq: Three times a day (TID) | ORAL | 0 refills | Status: DC | PRN

## 2019-09-01 MED ORDER — PENICILLIN V POTASSIUM 500 MG PO TABS
500.0000 mg | ORAL_TABLET | Freq: Once | ORAL | Status: AC
  Administered 2019-09-01: 500 mg via ORAL
  Filled 2019-09-01: qty 1

## 2019-09-01 MED ORDER — NAPROXEN 500 MG PO TABS
500.0000 mg | ORAL_TABLET | Freq: Once | ORAL | Status: AC
  Administered 2019-09-01: 500 mg via ORAL
  Filled 2019-09-01: qty 1

## 2019-09-01 MED ORDER — NAPROXEN 500 MG PO TABS: 500.0000 mg | ORAL_TABLET | Freq: Two times a day (BID) | ORAL | 0 refills | Status: DC

## 2019-09-01 NOTE — Discharge Instructions (Signed)
Take the prescribed medication as directed. Follow-up with dentist.  If you want to call Dr. Leanord Asal who is on call for [place] to see if you can be seen sooner you can contact his office in the morning. Return to the ED for new or worsening symptoms.

## 2020-11-04 ENCOUNTER — Emergency Department (HOSPITAL_COMMUNITY)
Admission: EM | Admit: 2020-11-04 | Discharge: 2020-11-05 | Disposition: A | Discharge status: Home / Self Care | Payer: Medicaid Other | Attending: Emergency Medicine | Admitting: Emergency Medicine

## 2020-11-04 ENCOUNTER — Encounter (HOSPITAL_COMMUNITY): Payer: Self-pay | Admitting: Emergency Medicine

## 2020-11-04 ENCOUNTER — Other Ambulatory Visit: Payer: Self-pay

## 2020-11-04 DIAGNOSIS — N760 Acute vaginitis: Secondary | ICD-10-CM | POA: Diagnosis not present

## 2020-11-04 DIAGNOSIS — N939 Abnormal uterine and vaginal bleeding, unspecified: Secondary | ICD-10-CM | POA: Diagnosis present

## 2020-11-04 DIAGNOSIS — N9489 Other specified conditions associated with female genital organs and menstrual cycle: Secondary | ICD-10-CM | POA: Diagnosis not present

## 2020-11-04 DIAGNOSIS — B9689 Other specified bacterial agents as the cause of diseases classified elsewhere: Secondary | ICD-10-CM | POA: Diagnosis not present

## 2020-11-04 DIAGNOSIS — D649 Anemia, unspecified: Secondary | ICD-10-CM | POA: Diagnosis not present

## 2020-11-04 DIAGNOSIS — M545 Low back pain, unspecified: Secondary | ICD-10-CM | POA: Diagnosis not present

## 2020-11-04 DIAGNOSIS — D500 Iron deficiency anemia secondary to blood loss (chronic): Secondary | ICD-10-CM

## 2020-11-04 NOTE — ED Triage Notes (Signed)
Pt has abdominal pain and vaginal bleeding starting after taking plan B on 10/23/20. Pt reports some dizziness and fatigue. Pt reports pain in her low back.

## 2020-11-05 LAB — RAPID HIV SCREEN (HIV 1/2 AB+AG)
HIV 1/2 Antibodies: NONREACTIVE
HIV-1 P24 Antigen - HIV24: NONREACTIVE

## 2020-11-05 LAB — BASIC METABOLIC PANEL
Anion gap: 7 (ref 5–15)
BUN: 18 mg/dL (ref 6–20)
CO2: 24 mmol/L (ref 22–32)
Calcium: 9.4 mg/dL (ref 8.9–10.3)
Chloride: 108 mmol/L (ref 98–111)
Creatinine, Ser: 0.75 mg/dL (ref 0.44–1.00)
GFR, Estimated: >60 mL/min (ref >60)
Glucose, Bld: 102 mg/dL — ABNORMAL HIGH (ref 70–99)
Potassium: 3.7 mmol/L (ref 3.5–5.1)
Sodium: 139 mmol/L (ref 135–145)

## 2020-11-05 LAB — URINALYSIS, ROUTINE W REFLEX MICROSCOPIC
Bacteria, UA: NONE SEEN
Bilirubin Urine: NEGATIVE
Glucose, UA: 50 mg/dL — AB
Ketones, ur: 5 mg/dL — AB
Leukocytes,Ua: NEGATIVE
Nitrite: NEGATIVE
Protein, ur: 100 mg/dL — AB
RBC / HPF: >50 RBC/hpf — ABNORMAL HIGH (ref 0–5)
Specific Gravity, Urine: 1.032 — ABNORMAL HIGH (ref 1.005–1.030)
pH: 6 (ref 5.0–8.0)

## 2020-11-05 LAB — CBC
HCT: 32.8 % — ABNORMAL LOW (ref 36.0–46.0)
Hemoglobin: 10.1 g/dL — ABNORMAL LOW (ref 12.0–15.0)
MCH: 23.9 pg — ABNORMAL LOW (ref 26.0–34.0)
MCHC: 30.8 g/dL (ref 30.0–36.0)
MCV: 77.7 fL — ABNORMAL LOW (ref 80.0–100.0)
Platelets: 207 10*3/uL (ref 150–400)
RBC: 4.22 MIL/uL (ref 3.87–5.11)
RDW: 14.9 % (ref 11.5–15.5)
WBC: 12.1 10*3/uL — ABNORMAL HIGH (ref 4.0–10.5)
nRBC: 0 % (ref 0.0–0.2)

## 2020-11-05 LAB — WET PREP, GENITAL
Sperm: NONE SEEN
Trich, Wet Prep: NONE SEEN
Yeast Wet Prep HPF POC: NONE SEEN

## 2020-11-05 LAB — RPR: RPR Ser Ql: NONREACTIVE

## 2020-11-05 LAB — I-STAT BETA HCG BLOOD, ED (MC, WL, AP ONLY): I-stat hCG, quantitative: <5 m[IU]/mL (ref <5)

## 2020-11-05 MED ORDER — MEGESTROL ACETATE 40 MG PO TABS
120.0000 mg | ORAL_TABLET | Freq: Once | ORAL | Status: AC
  Administered 2020-11-05: 120 mg via ORAL
  Filled 2020-11-05: qty 3

## 2020-11-05 MED ORDER — MEGESTROL ACETATE 40 MG PO TABS: ORAL_TABLET | ORAL | 1 refills | Status: AC

## 2020-11-05 NOTE — ED Notes (Signed)
Pelvic cart set up at bedside  

## 2020-11-05 NOTE — Discharge Instructions (Addendum)
Thank you for allowing me to care for you today in the Emergency Department.   Please call the OB/GYN's office to schedule a follow-up appointment.  They would like to see you in 1 month.  I would recommend calling when their office reopens on Monday to try to schedule this appointment a few weeks ahead of time.  Take 120 mg of Megace (3 tablets) once daily for a total of 5 days.  Then, take 80 mg of Megace (2 tablets) once daily for 5 days.  Then, take 40 mg of Megace (1 tablet) daily for the next 20 days.  I have also called you in a refill of this medication if the OB/GYN team ask you to take this medication for another month.  With vaginal bleeding, you can develop iron deficiency anemia.  Your blood work is suggestive of iron deficiency anemia.  To improve fatigue and your energy levels, taking iron supplements to replenish your iron levels as recommended.  SSS Tonic is a liquid iron supplement that may be easier to tolerate on your stomach to replenish your iron levels.  Use as directed on the label.  As we discussed, several of your test are pending, including your gonorrhea, syphilis, chlamydia, and HIV test.  You can download the app MyChart on your smart phone to view these results once they are available.  However, you should receive a call from the hospital if any of the test are positive.  Return to the emergency department if you continue to have heavy, uncontrollable bleeding if you have been taking Megace for several days and develop respiratory distress, if you pass out, if you start having fever, severe abdominal pain, or other new, concerning symptoms.

## 2020-11-05 NOTE — ED Notes (Signed)
RN was setting up pelvic exam and patient said they were ready to go home and have their regular doctor do their exam. Mia, PA notified.

## 2020-11-05 NOTE — ED Provider Notes (Signed)
[facility]-EMERGENCY DEPT Provider Note   CSN: [id number] Arrival date & time: 11/04/20  2130     History Chief Complaint  Patient presents with   Vaginal Bleeding    Leslie Simpson is a 30 y.o. female with no chronic medical conditions who presents the emergency department with a chief complaint of vaginal bleeding. The patient reports that she used the plan be pill on July 31.  She reports that she developed vaginal bleeding around August 5-6.  Bleeding has continued since onset, but around August 8 the bleeding worsened.  Reports that at that time she was changing her pad around every 45 minutes to an hour.  She reports that over the last 2 days, that bleeding has become even more brisk and she is changing her pad about every 30 to 45 minutes.  She reports bilateral low back pain has developed over the last few days and she has had worsening fatigue and intermittent lightheadedness.  She has a history of regular periods that typically last for about a week.  Her last menstrual cycle was on July 16.  She reports that she does have some concern for STIs, but is had no vaginal bleeding or vaginal pain.  She denies abdominal pain, vomiting, diarrhea, constipation, fever, chills, dysuria, urinary frequency or hesitancy, rash, shortness of breath, or chest pain.  No treatment prior to arrival.   The history is provided by the patient and medical records. No language interpreter was used.      Past Medical History:  Diagnosis Date   Abnormal pap 2012    Patient Active Problem List   Diagnosis Date Noted   Previous cesarean delivery, delivered 12/01/2015   LGSIL (low grade squamous intraepithelial lesion) on Pap smear 09/23/2010    Past Surgical History:  Procedure Laterality Date   CESAREAN SECTION     HERNIA REPAIR  2001   umbilical     OB History     Gravida  3   Para  3   Term  3   Preterm      AB      Living  3      SAB      IAB       Ectopic      Multiple  0   Live Births  1           Family History  Problem Relation Age of Onset   Asthma Mother    Hypertension Father    Diabetes Father     Social History   Tobacco Use   Smoking status: Never   Smokeless tobacco: Never  Substance Use Topics   Alcohol use: Yes    Comment: social drinking   Drug use: No    Home Medications Prior to Admission medications   Medication Sig Start Date End Date Taking? Authorizing Provider  levonorgestrel (PLAN B 1-STEP) 1.5 MG tablet Take 1.5 mg by mouth once.   Yes [provider]  megestrol (MEGACE) 40 MG tablet Take 3 tablets (120 mg total) by mouth daily for 5 days, THEN 2 tablets (80 mg total) daily for 5 days, THEN 1 tablet (40 mg total) daily for 20 days. 11/05/20 12/05/20 Yes Rainah Kirshner A, PA-C    Allergies    Raspberry and Shellfish allergy  Review of Systems   Review of Systems  Constitutional:  Positive for fatigue. Negative for activity change, chills and fever.  HENT:  Negative for congestion and sore throat.  Eyes:  Negative for visual disturbance.  Respiratory:  Negative for cough, shortness of breath and wheezing.   Cardiovascular:  Negative for chest pain.  Gastrointestinal:  Negative for abdominal pain, constipation, diarrhea, nausea and vomiting.  Genitourinary:  Positive for vaginal bleeding. Negative for dysuria, flank pain, frequency, urgency, vaginal discharge and vaginal pain.  Musculoskeletal:  Positive for back pain. Negative for arthralgias, joint swelling, myalgias and neck stiffness.  Skin:  Negative for rash and wound.  Allergic/Immunologic: Negative for immunocompromised state.  Neurological:  Positive for light-headedness. Negative for dizziness, seizures, syncope, weakness, numbness and headaches.  Psychiatric/Behavioral:  Negative for confusion, dysphoric mood, hallucinations and self-injury. The patient is not hyperactive.    Physical Exam Updated Vital Signs BP  103/63 (BP Location: Left Arm)   Pulse 70   Temp 98.6 F (37 C) (Oral)   Resp 15   Ht 5\' 1"  (1.549 m)   Wt 90.7 kg   SpO2 100%   BMI 37.79 kg/m   Physical Exam Vitals and nursing note reviewed.  Constitutional:      General: She is not in acute distress.    Appearance: She is not ill-appearing, toxic-appearing or diaphoretic.  HENT:     Head: Normocephalic.  Eyes:     Conjunctiva/sclera: Conjunctivae normal.  Cardiovascular:     Rate and Rhythm: Normal rate and regular rhythm.     Heart sounds: No murmur heard.   No friction rub. No gallop.  Pulmonary:     Effort: Pulmonary effort is normal. No respiratory distress.     Breath sounds: No stridor. No wheezing, rhonchi or rales.  Chest:     Chest wall: No tenderness.  Abdominal:     General: There is no distension.     Palpations: Abdomen is soft. There is no mass.     Tenderness: There is no abdominal tenderness. There is no right CVA tenderness, left CVA tenderness, guarding or rebound.     Hernia: No hernia is present.     Comments: Abdomen is soft, nontender, nondistended.  Genitourinary:    Comments: Chaperoned exam.  Brisk bleeding noted in the vaginal vault.  No clots.  No discharge or odor.  No cervical motion tenderness.  No adnexal tenderness or masses bilaterally.  No obvious wounds or lacerations noted. Musculoskeletal:     Cervical back: Neck supple.  Skin:    General: Skin is warm.     Findings: No rash.  Neurological:     Mental Status: She is alert.  Psychiatric:        Behavior: Behavior normal.    ED Results / Procedures / Treatments   Labs (all labs ordered are listed, but only abnormal results are displayed) Labs Reviewed  WET PREP, GENITAL - Abnormal; Notable for the following components:      Result Value   Clue Cells Wet Prep HPF POC PRESENT (*)    WBC, Wet Prep HPF POC FEW (*)    All other components within normal limits  CBC - Abnormal; Notable for the following components:   WBC 12.1  (*)    Hemoglobin 10.1 (*)    HCT 32.8 (*)    MCV 77.7 (*)    MCH 23.9 (*)    All other components within normal limits  BASIC METABOLIC PANEL - Abnormal; Notable for the following components:   Glucose, Bld 102 (*)    All other components within normal limits  URINALYSIS, ROUTINE W REFLEX MICROSCOPIC - Abnormal; Notable for the following  components:   Color, Urine STRAW (*)    APPearance TURBID (*)    Specific Gravity, Urine 1.032 (*)    Glucose, UA 50 (*)    Hgb urine dipstick MODERATE (*)    Ketones, ur 5 (*)    Protein, ur 100 (*)    RBC / HPF >50 (*)    All other components within normal limits  RAPID HIV SCREEN (HIV 1/2 AB+AG)  RPR  I-STAT BETA HCG BLOOD, ED ([facility], [facility], [facility] ONLY)  GC/CHLAMYDIA PROBE AMP ([place]) NOT AT [facility]    EKG None  Radiology No results found.  Procedures Procedures   Medications Ordered in ED Medications  megestrol (MEGACE) tablet 120 mg (120 mg Oral Given 11/05/20 0314)    ED Course  I have reviewed the triage vital signs and the nursing notes.  Pertinent labs & imaging results that were available during my care of the patient were reviewed by me and considered in my medical decision making (see chart for details).  Clinical Course as of 11/05/20 3875  Sat Nov 05, 2020  0208 Spoke with Dr. Despina Hidden, OB/GYN.  He recommends placing the patient on Megace for 1 month.  He recommends the following course: 120 mg daily for 5 days, followed by 80 mg daily for 5 days, then 40 mg daily for a total of 45 of the 40 mg tablets that should last the patient for a month with 1 refill.  He would like her to be seen in the OB/GYN clinic in 1 month for follow-up. [MM]    Clinical Course User Index [MM] Soma Bachand, Coral Else, PA-C   MDM Rules/Calculators/A&P                           30 year old female with no chronic medical conditions who presents the emergency department with vaginal bleeding.  The patient used Plan B on July 31 and has had vaginal bleeding  for the last 7 to 8 days.  Bleeding has continued to intensify since onset.  She has now started to have bilateral low back pain, intermittent lightheadedness, and worsening fatigue.  No other associated symptoms.  Vital signs are stable.  On exam, patient does have a brisk vaginal bleeding noted in the vaginal vault.  No wounds or lacerations.  No cervical motion tenderness, discharge, odors, adnexal tenderness or masses.  Labs and imaging of been reviewed and independently interpreted by me.  Pregnancy test is negative.  Abdomen is benign.  I have a low suspicion for spontaneous abortion or ectopic pregnancy contributing to bleeding.  Hemoglobin is 0.1 and she has a microcytic anemia, but this appears stable from previous.  No metabolic derangements.  UA is not concerning for infection.  Wet prep with clue cells.  GC chlamydia, RPR, and HIV test are pending.  Given persistent brisk bleeding, spoke with OB/GYN who recommends Megace.  Patient is agreeable with this plan.  She will follow-up with OB/GYN in 1 month.  Since she has a microcytic anemia concerning for iron deficiency anemia, I recommended iron supplementation.  Patient states that she will not likely start this medication as she does not like the way that it upsets her stomach.  Wet prep was also concerning for bacterial vaginosis.  Discussed Flagyl, but patient declined medication because it upsets her stomach.  Discussed that she can follow-up with OB/GYN if she has continued concerns about bacterial vaginosis.  She is aware that several test are  pending.  Discussed that she can view the results in MyChart or she should receive a call from the hospital if these test are positive.  All questions answered.  She is ambulatory in the emergency department and is asymptomatic.  ER return precautions given.  She is hemodynamically stable in no acute distress.  Safer discharge home with outpatient follow-up as discussed.  Final Clinical Impression(s)  / ED Diagnoses Final diagnoses:  Vaginal bleeding  Symptomatic anemia  Iron deficiency anemia due to chronic blood loss  Bacterial vaginosis    Rx / DC Orders ED Discharge Orders          Ordered    megestrol (MEGACE) 40 MG tablet        11/05/20 0259             Justyce Yeater, Pedro Earls A, PA-C 11/05/20 2595    Zadie Rhine, MD 11/05/20 [id number]

## 2020-11-07 LAB — GC/CHLAMYDIA PROBE AMP (~~LOC~~) NOT AT ARMC
Chlamydia: NEGATIVE
Comment: NEGATIVE
Comment: NORMAL
Neisseria Gonorrhea: NEGATIVE

## 2021-08-24 ENCOUNTER — Encounter: Payer: Self-pay | Admitting: Plastic Surgery

## 2021-08-24 ENCOUNTER — Ambulatory Visit (INDEPENDENT_AMBULATORY_CARE_PROVIDER_SITE_OTHER): Payer: Medicaid Other | Admitting: Plastic Surgery

## 2021-08-24 VITALS — BP 104/71 | HR 60 | Ht 61.0 in | Wt 186.4 lb

## 2021-08-24 DIAGNOSIS — M545 Low back pain, unspecified: Secondary | ICD-10-CM | POA: Diagnosis not present

## 2021-08-24 DIAGNOSIS — Z1231 Encounter for screening mammogram for malignant neoplasm of breast: Secondary | ICD-10-CM

## 2021-08-24 DIAGNOSIS — G8929 Other chronic pain: Secondary | ICD-10-CM

## 2021-08-24 DIAGNOSIS — N62 Hypertrophy of breast: Secondary | ICD-10-CM | POA: Diagnosis not present

## 2021-08-24 DIAGNOSIS — M546 Pain in thoracic spine: Secondary | ICD-10-CM

## 2021-08-24 DIAGNOSIS — M549 Dorsalgia, unspecified: Secondary | ICD-10-CM | POA: Insufficient documentation

## 2021-08-24 DIAGNOSIS — R21 Rash and other nonspecific skin eruption: Secondary | ICD-10-CM

## 2021-08-24 DIAGNOSIS — M542 Cervicalgia: Secondary | ICD-10-CM | POA: Diagnosis not present

## 2021-08-24 NOTE — Progress Notes (Signed)
Patient ID: Leslie Simpson, female    DOB: 10/17/1990, 31 y.o.   MRN: [id number]   Chief Complaint  Patient presents with   consult   Breast Problem    Mammary Hyperplasia: The patient is a 31 y.o. female with a history of mammary hyperplasia for several years.  She has extremely large breasts causing symptoms that include the following: Back pain in the upper and lower back, including neck pain. She pulls or pins her bra straps to provide better lift and relief of the pressure and pain. She notices relief by holding her breast up manually.  Her shoulder straps cause grooves and pain and pressure that requires padding for relief. Pain medication is sometimes required with motrin and tylenol.  Activities that are hindered by enlarged breasts include: exercise and running.  She has tried supportive clothing as well as fitted bras without improvement.  Her breasts are extremely large and fairly symmetric.  She has hyperpigmentation of the inframammary area on both sides.  The sternal to nipple distance on the right is 32 cm and the left is 28 cm.  The IMF distance is 16 cm.  She is 5 feet 1 inches tall and weighs 186 pounds.  The BMI = 35.1 kg/m.  Preoperative bra size = H cup.  The estimated excess breast tissue to be removed at the time of surgery = 550-575 grams on the left and 550-575 grams on the right.  Mammogram history: none.  Family history of breast cancer:  none.  Tobacco use:  none.   The patient expresses the desire to pursue surgical intervention. The patient has three kids.    Review of Systems  Constitutional: Negative.   HENT: Negative.    Eyes: Negative.   Respiratory: Negative.    Cardiovascular: Negative.  Negative for leg swelling.  Gastrointestinal: Negative.   Endocrine: Negative.   Genitourinary: Negative.   Musculoskeletal:  Positive for back pain and neck pain.  Skin:  Positive for rash.  Psychiatric/Behavioral: Negative.     Past Medical History:  Diagnosis  Date   Abnormal pap 2012    Past Surgical History:  Procedure Laterality Date   CESAREAN SECTION     HERNIA REPAIR  2001   umbilical      Current Outpatient Medications:    levonorgestrel (PLAN B 1-STEP) 1.5 MG tablet, Take 1.5 mg by mouth once., Disp: , Rfl:    Objective:   Vitals:   08/24/21 1440  BP: 104/71  Pulse: 60  SpO2: 100%    Physical Exam Vitals and nursing note reviewed.  Constitutional:      Appearance: Normal appearance.  HENT:     Head: Normocephalic and atraumatic.  Cardiovascular:     Rate and Rhythm: Normal rate.     Pulses: Normal pulses.  Pulmonary:     Effort: Pulmonary effort is normal.  Abdominal:     General: There is no distension.     Palpations: Abdomen is soft.     Tenderness: There is no abdominal tenderness.  Skin:    General: Skin is warm.     Coloration: Skin is not jaundiced.     Findings: No bruising.  Neurological:     Mental Status: She is alert and oriented to person, place, and time.  Psychiatric:        Mood and Affect: Mood normal.        Behavior: Behavior normal.        Thought Content: Thought content  normal.    Assessment & Plan:  Symptomatic mammary hypertrophy  Neck pain  Chronic bilateral thoracic back pain  The procedure the patient selected and that was best for the patient was discussed. The risk were discussed and include but not limited to the following:  Breast asymmetry, fluid accumulation, firmness of the breast, inability to breast feed, loss of nipple or areola, skin loss, change in skin and nipple sensation, fat necrosis of the breast tissue, bleeding, infection and healing delay.  There are risks of anesthesia and injury to nerves or blood vessels.  Allergic reaction to tape, suture and skin glue are possible.  There will be swelling.  Any of these can lead to the need for revisional surgery.  A breast reduction has potential to interfere with diagnostic procedures in the future.  This procedure is  best done when the breast is fully developed.  Changes in the breast will continue to occur over time: pregnancy, weight gain or weigh loss.    Total time: 40 minutes. This includes time spent with the patient during the visit as well as time spent before and after the visit reviewing the chart, documenting the encounter, ordering pertinent studies and literature for the patient.   Physical therapy:  ordered Mammogram:  ordered  The patient is a good candidate for bilateral breast reduction with possible liposuction.  She is willing to do PT if necessary.  She would like a MM prior to surgery.  Pictures were obtained of the patient and placed in the chart with the patient's or guardian's permission.   Alena Bills Forbes Loll, DO

## 2021-08-25 NOTE — Addendum Note (Signed)
Addended by: Angelita Ingles on: 08/25/2021 08:35 AM   Modules accepted: Orders

## 2021-09-12 ENCOUNTER — Telehealth: Payer: Self-pay | Admitting: Plastic Surgery

## 2021-09-12 NOTE — Telephone Encounter (Signed)
Faxed surgery prior auth to insurance.

## 2021-09-29 ENCOUNTER — Ambulatory Visit: Payer: Medicaid Other | Admitting: Student

## 2021-11-15 ENCOUNTER — Ambulatory Visit: Payer: Medicaid Other

## 2021-12-01 ENCOUNTER — Ambulatory Visit
Admission: RE | Admit: 2021-12-01 | Discharge: 2021-12-01 | Disposition: A | Discharge status: Home / Self Care | Payer: Medicaid Other | Source: Ambulatory Visit | Attending: Plastic Surgery | Admitting: Plastic Surgery

## 2021-12-01 DIAGNOSIS — N62 Hypertrophy of breast: Secondary | ICD-10-CM

## 2021-12-01 DIAGNOSIS — G8929 Other chronic pain: Secondary | ICD-10-CM

## 2021-12-01 DIAGNOSIS — M542 Cervicalgia: Secondary | ICD-10-CM

## 2021-12-21 ENCOUNTER — Ambulatory Visit: Payer: Medicaid Other

## 2021-12-27 ENCOUNTER — Encounter: Payer: Medicaid Other | Admitting: Student

## 2022-01-01 ENCOUNTER — Encounter (HOSPITAL_BASED_OUTPATIENT_CLINIC_OR_DEPARTMENT_OTHER): Payer: Self-pay | Admitting: Plastic Surgery

## 2022-01-02 ENCOUNTER — Telehealth: Payer: Self-pay

## 2022-01-02 NOTE — Telephone Encounter (Signed)
Returned Leslie Simpson's call. Pt called to cancel surgery, did not know why. Spoke with patient, confirmed she requested to cancel. Inquired if she wanted to rescheduled. Call was immediately disrupted and disconnected. [name] with [facility] schedulers is aware and cancelled surgery. Removed from Dr. Eusebio Friendly calendar.

## 2022-01-10 ENCOUNTER — Encounter (HOSPITAL_BASED_OUTPATIENT_CLINIC_OR_DEPARTMENT_OTHER): Payer: Self-pay

## 2022-01-10 ENCOUNTER — Ambulatory Visit (HOSPITAL_BASED_OUTPATIENT_CLINIC_OR_DEPARTMENT_OTHER): Admit: 2022-01-10 | Payer: Medicaid Other | Admitting: Plastic Surgery

## 2022-01-10 HISTORY — DX: Anxiety disorder, unspecified: F41.9

## 2022-01-10 HISTORY — DX: Anemia, unspecified: D64.9

## 2022-01-10 HISTORY — DX: Prediabetes: R73.03

## 2022-01-10 SURGERY — MAMMOPLASTY, REDUCTION
Anesthesia: General | Site: Breast | Laterality: Bilateral

## 2022-01-19 ENCOUNTER — Encounter: Payer: Medicaid Other | Admitting: Plastic Surgery

## 2022-01-31 ENCOUNTER — Encounter: Payer: Medicaid Other | Admitting: Student

## 2022-09-28 ENCOUNTER — Emergency Department (HOSPITAL_COMMUNITY)
Admission: EM | Admit: 2022-09-28 | Discharge: 2022-09-28 | Disposition: A | Discharge status: Home / Self Care | Payer: Medicaid Other | Attending: Emergency Medicine | Admitting: Emergency Medicine

## 2022-09-28 ENCOUNTER — Encounter (HOSPITAL_COMMUNITY): Payer: Self-pay

## 2022-09-28 ENCOUNTER — Other Ambulatory Visit: Payer: Self-pay

## 2022-09-28 DIAGNOSIS — H5789 Other specified disorders of eye and adnexa: Secondary | ICD-10-CM | POA: Diagnosis not present

## 2022-09-28 DIAGNOSIS — H04123 Dry eye syndrome of bilateral lacrimal glands: Secondary | ICD-10-CM

## 2022-09-28 MED ORDER — NAPHAZOLINE-GLYCERIN 0.012-0.25 % OP SOLN
1.0000 [drp] | Freq: Four times a day (QID) | OPHTHALMIC | Status: DC | PRN
  Filled 2022-09-28: qty 15

## 2022-09-28 NOTE — Discharge Instructions (Addendum)
It was a pleasure caring for you today. Physical exam was concerning of dry eye. I have provided you with eye drops here in ED. Follow up with ophthalmology if symptoms do not improve within 72 hours. Seek emergency care if experiencing any new or worsening symptoms.

## 2022-09-28 NOTE — ED Provider Notes (Signed)
[place] EMERGENCY DEPARTMENT AT [facility] Provider Note   CSN: [id number] Arrival date & time: 09/28/22  0909     History  Chief Complaint  Patient presents with   Eye Problem    Leslie Simpson is a 32 y.o. female who presents emergency room concerned for eye burning and red eye x2 weeks. Denies vision changes, foreign body sensation, or eye trauma. Denies eye pressure or headache. Patient stating that burning increases with eye stress such as looking at computer screen or driving - resolves with rest. States that eyes are red by the end of the day. Also endorsing watery discharge.  Denies contact lens use.  Denies fever, chest pain, dyspnea, abdominal pain, nausea, vomiting.    Eye Problem Associated symptoms: redness        Home Medications Prior to Admission medications   Not on File      Allergies    Raspberry and Shellfish allergy    Review of Systems   Review of Systems  Eyes:  Positive for redness.    Physical Exam Updated Vital Signs BP (!) 96/57 (BP Location: Left Arm)   Pulse 64   Temp 98.8 F (37.1 C) (Oral)   Resp 18   Ht 5\' 1"  (1.549 m)   Wt 84.6 kg   SpO2 99%   BMI 35.24 kg/m  Physical Exam Vitals and nursing note reviewed.  Constitutional:      General: She is not in acute distress.    Appearance: She is not ill-appearing or toxic-appearing.  HENT:     Head: Normocephalic and atraumatic.  Eyes:     General: Lids are normal. Vision grossly intact. No scleral icterus.       Right eye: No discharge.        Left eye: No discharge.     Extraocular Movements: Extraocular movements intact.     Conjunctiva/sclera: Conjunctivae normal.     Right eye: Right conjunctiva is not injected. No chemosis, exudate or hemorrhage.    Left eye: Left conjunctiva is not injected. No chemosis, exudate or hemorrhage.    Comments: No erythema or conjunctival injection. No periocular swelling. No discharge appreciated. Visual acuity test: Bilateral  Near: 20/30 Bilateral Distance: 20/20 R Near: 20/30 R Distance: 20/20 L Near: 20/30 L Distance: 20/20  Cardiovascular:     Rate and Rhythm: Normal rate.  Pulmonary:     Effort: Pulmonary effort is normal.  Abdominal:     General: Abdomen is flat.  Skin:    General: Skin is warm and dry.  Neurological:     General: No focal deficit present.     Mental Status: She is alert. Mental status is at baseline.  Psychiatric:        Mood and Affect: Mood normal.        Behavior: Behavior normal.     ED Results / Procedures / Treatments   Labs (all labs ordered are listed, but only abnormal results are displayed) Labs Reviewed - No data to display  EKG None  Radiology No results found.  Procedures Procedures    Medications Ordered in ED Medications - No data to display  ED Course/ Medical Decision Making/ A&P                             Medical Decision Making  This patient presents to the ED for concern of eye burning and red eye, this involves an extensive number  of treatment options, and is a complaint that carries with it a high risk of complications and morbidity.  The differential diagnosis includes blepharitis, viral/bacterial conjunctivitis, corneal abrasion, dry eye syndrome, subconjunctival hemorrhage, acute angle-closure glaucoma, iritis, keratitis, scleritis   Co morbidities that complicate the patient evaluation  none    Lab Tests:  Patient denying infectious symptoms, so labs were not necessary at this time. Patient agreed to withhold labs.   Problem List / ED Course / Critical interventions / Medication management  Patient presents to emergency room concerned for burning eyes and red eyes.  Symptoms have been toward the end of the day when patient has eye stress such as looking at computer screen or driving.  Eye exam unremarkable - no conjunctival injection or discharge appreciated. Vision grossly intact.  Patient denies recent eye trauma.  States that  eye discharge is watery/tear like.  Patient's history is concerning for dry eyes given that symptoms start towards end of the day after patient has been staring at screen or driving.  Provided patient with eyedrops in ED.  Provided patient with return precautions-educated to seek ophthalmology care if symptoms do not resolve with treatment after 72 hours.  Patient verbally endorsed understanding of plan. I have reviewed the patients home medicines and have made adjustments as needed Patient afebrile with stable vitals.  Patient given return precautions.  Patient discharged in good condition.  DDx: these are considered less likely due to history of present illness and physical exam -blepharitis: no lid inflammation or greasy flakes on eyelids -viral/bacterial conjunctivitis: no conjunctival injection, no itching or purulent discharge, no provoking viral illness -corneal abrasion: no direct trauma, severe pain, photophobia, or foreign body sensation; does not wear contacts -subconjunctival hemorrhage: physical exam reassuring -acute angle-closure glaucoma: denies eye pressure; no nausea or vomit -iritis/keratitis: physical exam reassuring   Social Determinants of Health:  none         Final Clinical Impression(s) / ED Diagnoses Final diagnoses:  Dry eyes, bilateral    Rx / DC Orders ED Discharge Orders     None         Dorthy Cooler, [place] 09/30/22 1324    Rozelle Logan, DO 10/08/22 1611

## 2022-09-28 NOTE — ED Triage Notes (Signed)
Patient reports eye burning, redness, and drainage x "several days." Denies vision changes.

## 2022-09-28 NOTE — ED Notes (Signed)
Called pharmacy about missing med and she had it in her hand and is going to tube it now

## 2022-11-19 ENCOUNTER — Emergency Department (HOSPITAL_COMMUNITY): Payer: Medicaid Other

## 2022-11-19 ENCOUNTER — Encounter (HOSPITAL_COMMUNITY): Payer: Self-pay | Admitting: Emergency Medicine

## 2022-11-19 ENCOUNTER — Emergency Department (HOSPITAL_COMMUNITY)
Admission: EM | Admit: 2022-11-19 | Discharge: 2022-11-19 | Discharge status: Against Medical Advice | Payer: Medicaid Other | Attending: Emergency Medicine | Admitting: Emergency Medicine

## 2022-11-19 DIAGNOSIS — M25512 Pain in left shoulder: Secondary | ICD-10-CM | POA: Diagnosis not present

## 2022-11-19 DIAGNOSIS — M25552 Pain in left hip: Secondary | ICD-10-CM | POA: Diagnosis not present

## 2022-11-19 DIAGNOSIS — Z5329 Procedure and treatment not carried out because of patient's decision for other reasons: Secondary | ICD-10-CM | POA: Diagnosis not present

## 2022-11-19 DIAGNOSIS — M546 Pain in thoracic spine: Secondary | ICD-10-CM | POA: Insufficient documentation

## 2022-11-19 DIAGNOSIS — Y92410 Unspecified street and highway as the place of occurrence of the external cause: Secondary | ICD-10-CM | POA: Insufficient documentation

## 2022-11-19 DIAGNOSIS — M542 Cervicalgia: Secondary | ICD-10-CM | POA: Insufficient documentation

## 2022-11-19 LAB — PREGNANCY, URINE: Preg Test, Ur: NEGATIVE

## 2022-11-19 MED ORDER — ACETAMINOPHEN 325 MG PO TABS
650.0000 mg | ORAL_TABLET | Freq: Once | ORAL | Status: AC
  Administered 2022-11-19: 650 mg via ORAL
  Filled 2022-11-19: qty 2

## 2022-11-19 MED ORDER — LIDOCAINE 5 % EX PTCH
1.0000 | MEDICATED_PATCH | CUTANEOUS | Status: DC
  Administered 2022-11-19: 1 via TRANSDERMAL
  Filled 2022-11-19: qty 1

## 2022-11-19 MED ORDER — KETOROLAC TROMETHAMINE 15 MG/ML IJ SOLN
15.0000 mg | Freq: Once | INTRAMUSCULAR | Status: AC
  Administered 2022-11-19: 15 mg via INTRAMUSCULAR
  Filled 2022-11-19: qty 1
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
## 2022-11-19 NOTE — ED Notes (Signed)
Pt left due to having to pick up daughter, Thayer [place]m [place] notified
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
## 2023-03-15 ENCOUNTER — Emergency Department (HOSPITAL_COMMUNITY)
Admission: EM | Admit: 2023-03-15 | Discharge: 2023-03-15 | Discharge status: Against Medical Advice | Payer: No Typology Code available for payment source | Attending: Emergency Medicine | Admitting: Emergency Medicine

## 2023-03-15 ENCOUNTER — Other Ambulatory Visit: Payer: Self-pay

## 2023-03-15 ENCOUNTER — Encounter (HOSPITAL_COMMUNITY): Payer: Self-pay

## 2023-03-15 DIAGNOSIS — Z5329 Procedure and treatment not carried out because of patient's decision for other reasons: Secondary | ICD-10-CM | POA: Insufficient documentation

## 2023-03-15 DIAGNOSIS — Z041 Encounter for examination and observation following transport accident: Secondary | ICD-10-CM | POA: Diagnosis present

## 2023-03-15 DIAGNOSIS — Y92410 Unspecified street and highway as the place of occurrence of the external cause: Secondary | ICD-10-CM | POA: Diagnosis not present
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
## 2023-03-15 NOTE — ED Notes (Signed)
Pt stated they were unsatisfied being placed in a Hall bed. Pt was educated that that was the available and appropriate bed. Pt left with daughter.

## 2023-03-19 ENCOUNTER — Emergency Department (HOSPITAL_BASED_OUTPATIENT_CLINIC_OR_DEPARTMENT_OTHER)
Admission: EM | Admit: 2023-03-19 | Discharge: 2023-03-19 | Disposition: A | Discharge status: Home / Self Care | Payer: No Typology Code available for payment source | Attending: Emergency Medicine | Admitting: Emergency Medicine

## 2023-03-19 ENCOUNTER — Encounter (HOSPITAL_BASED_OUTPATIENT_CLINIC_OR_DEPARTMENT_OTHER): Payer: Self-pay

## 2023-03-19 ENCOUNTER — Emergency Department (HOSPITAL_BASED_OUTPATIENT_CLINIC_OR_DEPARTMENT_OTHER): Payer: No Typology Code available for payment source

## 2023-03-19 ENCOUNTER — Other Ambulatory Visit: Payer: Self-pay

## 2023-03-19 DIAGNOSIS — M5412 Radiculopathy, cervical region: Secondary | ICD-10-CM | POA: Diagnosis not present

## 2023-03-19 DIAGNOSIS — M542 Cervicalgia: Secondary | ICD-10-CM | POA: Diagnosis present

## 2023-03-19 DIAGNOSIS — R1084 Generalized abdominal pain: Secondary | ICD-10-CM | POA: Insufficient documentation

## 2023-03-19 DIAGNOSIS — Y92410 Unspecified street and highway as the place of occurrence of the external cause: Secondary | ICD-10-CM | POA: Insufficient documentation

## 2023-03-19 LAB — COMPREHENSIVE METABOLIC PANEL
ALT: 10 U/L (ref 0–44)
AST: 13 U/L — ABNORMAL LOW (ref 15–41)
Albumin: 4.2 g/dL (ref 3.5–5.0)
Alkaline Phosphatase: 39 U/L (ref 38–126)
Anion gap: 8 (ref 5–15)
BUN: 13 mg/dL (ref 6–20)
CO2: 24 mmol/L (ref 22–32)
Calcium: 9 mg/dL (ref 8.9–10.3)
Chloride: 104 mmol/L (ref 98–111)
Creatinine, Ser: 0.69 mg/dL (ref 0.44–1.00)
GFR, Estimated: >60 mL/min (ref >60)
Glucose, Bld: 89 mg/dL (ref 70–99)
Potassium: 3.8 mmol/L (ref 3.5–5.1)
Sodium: 136 mmol/L (ref 135–145)
Total Bilirubin: 0.3 mg/dL (ref <1.2)
Total Protein: 6.9 g/dL (ref 6.5–8.1)

## 2023-03-19 LAB — CBC
HCT: 35.7 % — ABNORMAL LOW (ref 36.0–46.0)
Hemoglobin: 11.2 g/dL — ABNORMAL LOW (ref 12.0–15.0)
MCH: 24.3 pg — ABNORMAL LOW (ref 26.0–34.0)
MCHC: 31.4 g/dL (ref 30.0–36.0)
MCV: 77.4 fL — ABNORMAL LOW (ref 80.0–100.0)
Platelets: 227 10*3/uL (ref 150–400)
RBC: 4.61 MIL/uL (ref 3.87–5.11)
RDW: 14.6 % (ref 11.5–15.5)
WBC: 5.7 10*3/uL (ref 4.0–10.5)
nRBC: 0 % (ref 0.0–0.2)

## 2023-03-19 LAB — TROPONIN I (HIGH SENSITIVITY): Troponin I (High Sensitivity): <2 ng/L (ref <18)

## 2023-03-19 LAB — PREGNANCY, URINE: Preg Test, Ur: NEGATIVE

## 2023-03-19 LAB — HCG, SERUM, QUALITATIVE: Preg, Serum: NEGATIVE

## 2023-03-19 LAB — ETHANOL: Alcohol, Ethyl (B): <10 mg/dL (ref <10)

## 2023-03-19 MED ORDER — KETOROLAC TROMETHAMINE 15 MG/ML IJ SOLN
15.0000 mg | Freq: Once | INTRAMUSCULAR | Status: AC
  Administered 2023-03-19: 15 mg via INTRAVENOUS
  Filled 2023-03-19: qty 1

## 2023-03-19 MED ORDER — IOHEXOL 300 MG/ML  SOLN
100.0000 mL | Freq: Once | INTRAMUSCULAR | Status: AC | PRN
  Administered 2023-03-19: 85 mL via INTRAVENOUS

## 2023-03-19 MED ORDER — KETOROLAC TROMETHAMINE 60 MG/2ML IM SOLN
30.0000 mg | Freq: Once | INTRAMUSCULAR | Status: DC
  Filled 2023-03-19: qty 2
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
## 2023-03-19 NOTE — ED Notes (Signed)
 RN reviewed discharge instructions with pt. Pt verbalized understanding and had no further questions. VSS upon discharge.  

## 2023-09-02 ENCOUNTER — Other Ambulatory Visit: Payer: Self-pay

## 2023-09-02 ENCOUNTER — Other Ambulatory Visit (HOSPITAL_BASED_OUTPATIENT_CLINIC_OR_DEPARTMENT_OTHER): Payer: Self-pay

## 2023-09-02 ENCOUNTER — Emergency Department (HOSPITAL_BASED_OUTPATIENT_CLINIC_OR_DEPARTMENT_OTHER)
Admission: EM | Admit: 2023-09-02 | Discharge: 2023-09-02 | Disposition: A | Discharge status: Home / Self Care | Attending: Emergency Medicine | Admitting: Emergency Medicine

## 2023-09-02 ENCOUNTER — Encounter (HOSPITAL_BASED_OUTPATIENT_CLINIC_OR_DEPARTMENT_OTHER): Payer: Self-pay

## 2023-09-02 DIAGNOSIS — L03012 Cellulitis of left finger: Secondary | ICD-10-CM | POA: Insufficient documentation

## 2023-09-02 DIAGNOSIS — M79645 Pain in left finger(s): Secondary | ICD-10-CM | POA: Diagnosis present

## 2023-09-02 MED ORDER — LIDOCAINE HCL (PF) 1 % IJ SOLN
10.0000 mL | Freq: Once | INTRAMUSCULAR | Status: AC
  Administered 2023-09-02: 10 mL
  Filled 2023-09-02: qty 10

## 2023-09-02 MED ORDER — CELECOXIB 200 MG PO CAPS
200.0000 mg | ORAL_CAPSULE | Freq: Two times a day (BID) | ORAL | 0 refills | Status: AC | PRN
  Filled 2023-09-02: qty 30, 15d supply, fill #0

## 2023-09-02 MED ORDER — DOXYCYCLINE HYCLATE 100 MG PO CAPS
100.0000 mg | ORAL_CAPSULE | Freq: Two times a day (BID) | ORAL | 0 refills | Status: AC
  Filled 2023-09-02: qty 12, 6d supply, fill #0

## 2023-09-02 NOTE — ED Provider Notes (Signed)
 [place] EMERGENCY DEPARTMENT AT [facility] Provider Note   CSN: [id number] Arrival date & time: 09/02/23  1420     History  Chief Complaint  Patient presents with   Hand Pain    L index    Leslie Simpson is a 33 y.o. female.   Hand Pain   33 year old female presents emergency department was of left pointer finger pain, swelling.  States that she does have some radiation upward into her arm.  States that she did have a manicure around a month ago but denies any biting nails or pulling off of hangnail recently.  Denies any fevers, chills, night sweats.  Presents due to continued pain and swelling.  Past medical history significant for anemia, anxiety  Home Medications Prior to Admission medications   Not on File      Allergies    Raspberry and Shellfish allergy    Review of Systems   Review of Systems  All other systems reviewed and are negative.   Physical Exam Updated Vital Signs BP 108/61   Pulse (!) 58   Temp 98.5 F (36.9 C)   Resp 15   SpO2 100%  Physical Exam Vitals and nursing note reviewed.  Constitutional:      General: She is not in acute distress.    Appearance: She is well-developed.  HENT:     Head: Normocephalic and atraumatic.  Eyes:     Conjunctiva/sclera: Conjunctivae normal.  Cardiovascular:     Rate and Rhythm: Normal rate and regular rhythm.     Heart sounds: No murmur heard. Pulmonary:     Effort: Pulmonary effort is normal. No respiratory distress.     Breath sounds: Normal breath sounds.  Abdominal:     Palpations: Abdomen is soft.     Tenderness: There is no abdominal tenderness.  Musculoskeletal:        General: No swelling.     Cervical back: Neck supple.     Comments: Full range of motion left index finger.  Swelling, erythema appreciated the distal phalanx of left index finger.  Area of palpable fluctuance on the lateral aspect of nail bed.  Capillary fill less than 2 seconds.  Radial pulses 2+ bilaterally.  No  obvious bony tenderness.  Skin:    General: Skin is warm and dry.     Capillary Refill: Capillary refill takes less than 2 seconds.  Neurological:     Mental Status: She is alert.  Psychiatric:        Mood and Affect: Mood normal.     ED Results / Procedures / Treatments   Labs (all labs ordered are listed, but only abnormal results are displayed) Labs Reviewed - No data to display  EKG None  Radiology No results found.  Procedures .Incision and Drainage  Date/Time: 09/02/2023 3:28 PM  Performed by: [place] Butter, PA Authorized by: [place] Butter, PA   Consent:    Consent obtained:  Verbal   Consent given by:  Patient   Risks discussed:  Bleeding, incomplete drainage, pain and damage to other organs   Alternatives discussed:  No treatment Universal protocol:    Procedure explained and questions answered to patient or proxy's satisfaction: yes     Relevant documents present and verified: yes     Test results available : yes     Imaging studies available: yes     Required blood products, implants, devices, and special equipment available: yes     Site/side marked: yes  Immediately prior to procedure, a time out was called: yes     Patient identity confirmed:  Verbally with patient Location:    Type:  Abscess   Location:  Upper extremity   Upper extremity location:  Finger   Finger location:  L index finger Pre-procedure details:    Skin preparation:  Chlorhexidine with alcohol Anesthesia:    Anesthesia method:  Local infiltration   Local anesthetic:  Lidocaine  1% w/o epi Procedure type:    Complexity:  Simple Procedure details:    Incision types:  Stab incision   Incision depth:  Subcutaneous   Wound management:  Probed and deloculated, irrigated with saline and extensive cleaning   Drainage:  Purulent   Drainage amount:  Moderate   Wound treatment:  Wound left open   Packing materials:  None Post-procedure details:    Procedure completion:   Tolerated well, no immediate complications     Medications Ordered in ED Medications  lidocaine  (PF) (XYLOCAINE ) 1 % injection 10 mL (10 mLs Other Given 09/02/23 1501)    ED Course/ Medical Decision Making/ A&P                                 Medical Decision Making Risk Prescription drug management.   This patient presents to the ED for concern of finger pain, this involves an extensive number of treatment options, and is a complaint that carries with it a high risk of complications and morbidity.  The differential diagnosis includes fracture, strain/sprain, dislocation, ligamentous/tendinous injury, neurovascular compromise, paronychia, felon, other   Co morbidities that complicate the patient evaluation  See HPI   Additional history obtained:  Additional history obtained from EMR External records from outside source obtained and reviewed including hospital records   Lab Tests:  N/a   Imaging Studies ordered:  N/a   Cardiac Monitoring: / EKG:  N/a   Consultations Obtained:  N/a   Problem List / ED Course / Critical interventions / Medication management  Paronychia I ordered medication including lidocaine   Reevaluation of the patient after these medicines showed that the patient improved I have reviewed the patients home medicines and have made adjustments as needed   Social Determinants of Health:  Denies tobacco, licit drug use.   Test / Admission - Considered:  Paronychia Vitals signs within normal range and stable throughout visit. 33 year old female presents emergency department was of left pointer finger pain, swelling.  States that she does have some radiation upward into her arm.  States that she did have a manicure around a month ago but denies any biting nails or pulling off of hangnail recently.  Denies any fevers, chills, night sweats.  Presents due to continued pain and swelling. 33 year old female presents emergency department was of  left pointer finger pain, swelling.  States that she does have some radiation upward into her arm.  States that she did have a manicure around a month ago but denies any biting nails or pulling off of hangnail recently.  Denies any fevers, chills, night sweats.  Presents due to continued pain and swelling. On exam, area of palpable fluctuance lateral aspect of nailbed with surrounding erythema of the distal phalanx of left index finger.  Bedside ultrasound confirmed presence of paronychia without evidence of felon.  Area incised and drained and there is above.  Will place patient on antibiotics given surrounding cellulitic skin changes.  Recommend local wound care at home.  Follow-up recommended with PCP for reevaluation.  Treatment plan discussed with patient and she acknowledged understanding was agreeable to said plan.  Patient overall well-appearing, afebrile in no acute distress. Worrisome signs and symptoms were discussed with the patient, and the patient acknowledged understanding to return to the ED if noticed. Patient was stable upon discharge.          Final Clinical Impression(s) / ED Diagnoses Final diagnoses:  None    Rx / DC Orders ED Discharge Orders     None         Alba Butter, [place] 09/02/23 1529    Rosealee Concha, MD 09/02/23 1811

## 2023-09-02 NOTE — ED Notes (Signed)
 Discharge instructions, follow up care, and prescriptions reviewed and explained, pt verbalized understanding.

## 2023-09-02 NOTE — ED Triage Notes (Signed)
 Pt c/o "L pointer finger swelling since 2 days ago, it feels like a rubber band is tied around my finger that keeps getting tighter & tighter  Ibuprofen  w no relief.

## 2023-09-02 NOTE — Discharge Instructions (Addendum)
 Your abscess was drained while in the emergency department.  You develop something called a paronychia.  Recommend warm compresses at home to continue to drain the area.  Will put you on antibiotics to treat the infection.  Return if you develop any new or worsening symptoms as we discussed.
